# Patient Record
Sex: Female | Born: 1990 | Race: White | Hispanic: No | Marital: Married | Smoking: Never smoker
Health system: Southern US, Community
[De-identification: ages and names within clinical notes are randomized; demographics above are authoritative.]

## PROBLEM LIST (undated history)

## (undated) DIAGNOSIS — N6320 Unspecified lump in the left breast, unspecified quadrant: Secondary | ICD-10-CM

## (undated) DIAGNOSIS — R51 Headache: Secondary | ICD-10-CM

## (undated) DIAGNOSIS — R519 Headache, unspecified: Secondary | ICD-10-CM

## (undated) HISTORY — PX: WISDOM TOOTH EXTRACTION: SHX21

---

## 2011-01-31 ENCOUNTER — Ambulatory Visit: Payer: Self-pay | Admitting: Internal Medicine

## 2011-01-31 LAB — RAPID STREP-A WITH REFLX: Micro Text Report: NEGATIVE

## 2013-04-28 ENCOUNTER — Ambulatory Visit: Payer: Self-pay | Admitting: Emergency Medicine

## 2013-04-28 LAB — RAPID STREP-A WITH REFLX: MICRO TEXT REPORT: NEGATIVE

## 2013-05-01 LAB — BETA STREP CULTURE(ARMC)

## 2014-06-07 ENCOUNTER — Ambulatory Visit
Admission: EM | Admit: 2014-06-07 | Discharge: 2014-06-07 | Disposition: A | Discharge status: Home / Self Care | Payer: 59 | Attending: Family Medicine | Admitting: Family Medicine

## 2014-06-07 DIAGNOSIS — N390 Urinary tract infection, site not specified: Secondary | ICD-10-CM | POA: Diagnosis not present

## 2014-06-07 DIAGNOSIS — R300 Dysuria: Secondary | ICD-10-CM | POA: Diagnosis present

## 2014-06-07 DIAGNOSIS — R319 Hematuria, unspecified: Secondary | ICD-10-CM

## 2014-06-07 LAB — URINALYSIS COMPLETE WITH MICROSCOPIC (ARMC ONLY)
Bilirubin Urine: NEGATIVE
GLUCOSE, UA: NEGATIVE mg/dL
KETONES UR: NEGATIVE mg/dL
Nitrite: POSITIVE — AB
PH: 5 (ref 5.0–8.0)
Protein, ur: 30 mg/dL — AB
SPECIFIC GRAVITY, URINE: 1.03 (ref 1.005–1.030)
Squamous Epithelial / LPF: NONE SEEN — AB

## 2014-06-07 MED ORDER — PHENAZOPYRIDINE HCL 200 MG PO TABS: 200.0000 mg | ORAL_TABLET | Freq: Three times a day (TID) | ORAL | Status: DC | PRN

## 2014-06-07 MED ORDER — CIPROFLOXACIN HCL 500 MG PO TABS: 500.0000 mg | ORAL_TABLET | Freq: Two times a day (BID) | ORAL | Status: DC

## 2014-06-07 MED ORDER — FLUCONAZOLE 150 MG PO TABS: 150.0000 mg | ORAL_TABLET | Freq: Once | ORAL | Status: DC

## 2014-06-07 NOTE — ED Notes (Signed)
Pt states "I think I have a UTi" c/o increased urgency/frequency with painful urination for the past 3 days..states same sx before with UTI

## 2014-06-07 NOTE — ED Provider Notes (Signed)
CSN: [id number]     Arrival date & time 06/07/14  1159 History   First MD Initiated Contact with Patient 06/07/14 1307     Chief Complaint  Patient presents with  . Urinary Frequency   (Consider location/radiation/quality/duration/timing/severity/associated sxs/prior Treatment) Patient is a 24 y.o. female presenting with frequency and dysuria. The history is provided by the patient. No language interpreter was used.  Urinary Frequency This is a new (Only one urinary tract infection before) problem. The current episode started more than 2 days ago (Started about 3-4 days ago). The problem has been gradually worsening. Pertinent negatives include no chest pain, no abdominal pain and no headaches. Nothing aggravates the symptoms. Nothing relieves the symptoms. She has tried nothing for the symptoms.  Dysuria Pain quality:  Burning Pain severity:  Moderate Onset quality:  Sudden Timing:  Constant Progression:  Waxing and waning Chronicity:  New Recent urinary tract infections: no   Relieved by:  None tried Associated symptoms: no abdominal pain   Risk factors: no hx of pyelonephritis, no hx of urolithiasis, no kidney transplant, not pregnant, no recurrent urinary tract infections, no renal cysts, no renal disease, not sexually active, not single kidney, no sexually transmitted infections and no urinary catheter     History reviewed. No pertinent past medical history. Past Surgical History  Procedure Laterality Date  . Wisdom tooth extraction     History reviewed. No pertinent family history. History  Substance Use Topics  . Smoking status: Never Smoker   . Smokeless tobacco: Not on file  . Alcohol Use: No   OB History    Gravida Para Term Preterm AB TAB SAB Ectopic Multiple Living   0 0 0 0 0 0 0 0 0 0      Review of Systems  Constitutional: Negative for activity change and appetite change.  Cardiovascular: Negative for chest pain.  Gastrointestinal: Negative for abdominal  pain.  Genitourinary: Positive for dysuria and frequency.  Neurological: Negative for headaches.    Allergies  Review of patient's allergies indicates no known allergies.  Home Medications   Prior to Admission medications   Medication Sig Start Date End Date Taking? Authorizing Provider  Norgestimate-Ethinyl Estradiol Triphasic 0.18/0.215/0.25 MG-35 MCG tablet Take 1 tablet by mouth daily.   Yes Historical Provider, MD  ciprofloxacin (CIPRO) 500 MG tablet Take 1 tablet (500 mg total) by mouth 2 (two) times daily. 06/07/14   Frederich Cha, MD  fluconazole (DIFLUCAN) 150 MG tablet Take 1 tablet (150 mg total) by mouth once. If needed for yeast infection. 06/07/14   Frederich Cha, MD  phenazopyridine (PYRIDIUM) 200 MG tablet Take 1 tablet (200 mg total) by mouth 3 (three) times daily as needed for pain. 06/07/14   Frederich Cha, MD   BP 107/61 mmHg  Pulse 77  Temp(Src) 98.6 F (37 C) (Oral)  Resp 16  Ht 5\' 5"  (1.651 m)  Wt 118 lb (53.524 kg)  BMI 19.64 kg/m2  SpO2 100%  LMP 05/19/2014 Physical Exam  Constitutional: She is oriented to person, place, and time. She appears well-developed and well-nourished.  HENT:  Head: Normocephalic and atraumatic.  Eyes: Pupils are equal, round, and reactive to light.  Neck: Neck supple.  Abdominal: Soft.  Neurological: She is alert and oriented to person, place, and time.  Skin: Skin is warm.  Psychiatric: Her behavior is normal.    ED Course  Procedures (including critical care time) Labs Review Labs Reviewed  URINALYSIS COMPLETEWITH MICROSCOPIC [facility])  - Abnormal; Notable for the  following:    APPearance CLOUDY (*)    Hgb urine dipstick 3+ (*)    Protein, ur 30 (*)    Nitrite POSITIVE (*)    Leukocytes, UA 1+ (*)    Bacteria, UA MANY (*)    Squamous Epithelial / LPF NONE SEEN (*)    All other components within normal limits  URINE CULTURE   After discussion we'll place on Diflucan, Pyridium and Cipro for 5 days. Recommend follow due  to blood in the urine in 2 weeks to make sure blood has cleared. Imaging Review No results found.   MDM   1. Urinary tract infection, acute   2. Hematuria        Frederich Cha, MD 06/09/14 [phone]

## 2014-06-07 NOTE — Discharge Instructions (Signed)
Antibiotic Medication Antibiotic medicine helps fight germs. Germs cause infections. This type of medicine will not work for colds, flu, or other viral infections. Tell your doctor if you:  Are allergic to any medicines.  Are pregnant or are trying to get pregnant.  Are taking other medicines.  Have other medical problems. HOME CARE  Take your medicine with a glass of water or food as told by your doctor.  Take the medicine as told. Finish them even if you start to feel better.  Do not give your medicine to other people.  Do not use your medicine in the future for a different infection.  Ask your doctor about which side effects to watch for.  Try not to miss any doses. If you miss a dose, take it as soon as possible. If it is almost time for your next dose, and your dosing schedule is:  Two doses a day, take the missed dose and the next dose 5 to 6 hours later.  Three or more doses a day, take the missed dose and the next dose 2 to 4 hours later, or double your next dose.  Then go back to your normal schedule. GET HELP RIGHT AWAY IF:   You get worse or do not get better within a few days.  The medicine makes you sick.  You develop a rash or any other side effects.  You have questions or concerns. MAKE SURE YOU:  Understand these instructions.  Will watch your condition.  Will get help right away if you are not doing well or get worse. Document Released: 10/21/2007 Document Revised: 04/05/2011 Document Reviewed: 12/17/2008 [facility] Patient Information 2015 [place], [place]. This information is not intended to replace advice given to you by your health care provider. Make sure you discuss any questions you have with your health care provider. Urinary Tract Infection A urinary tract infection (UTI) can occur any place along the urinary tract. The tract includes the kidneys, ureters, bladder, and urethra. A type of germ called bacteria often causes a UTI. UTIs are often  helped with antibiotic medicine.  HOME CARE   If given, take antibiotics as told by your doctor. Finish them even if you start to feel better.  Drink enough fluids to keep your pee (urine) clear or pale yellow.  Avoid tea, drinks with caffeine, and bubbly (carbonated) drinks.  Pee often. Avoid holding your pee in for a long time.  Pee before and after having sex (intercourse).  Wipe from front to back after you poop (bowel movement) if you are a woman. Use each tissue only once. GET HELP RIGHT AWAY IF:   You have back pain.  You have lower belly (abdominal) pain.  You have chills.  You feel sick to your stomach (nauseous).  You throw up (vomit).  Your burning or discomfort with peeing does not go away.  You have a fever.  Your symptoms are not better in 3 days. MAKE SURE YOU:   Understand these instructions.  Will watch your condition.  Will get help right away if you are not doing well or get worse. Document Released: 06/30/2007 Document Revised: 10/06/2011 Document Reviewed: 08/12/2011 [facility] Patient Information 2015 [place], [place]. This information is not intended to replace advice given to you by your health care provider. Make sure you discuss any questions you have with your health care provider. Hematuria Hematuria is blood in your urine. It can be caused by a bladder infection, kidney infection, prostate infection, kidney stone, or cancer  of your urinary tract. Infections can usually be treated with medicine, and a kidney stone usually will pass through your urine. If neither of these is the cause of your hematuria, further workup to find out the reason may be needed. It is very important that you tell your health care provider about any blood you see in your urine, even if the blood stops without treatment or happens without causing pain. Blood in your urine that happens and then stops and then happens again can be a symptom of a very serious condition. Also,  pain is not a symptom in the initial stages of many urinary cancers. HOME CARE INSTRUCTIONS   Drink lots of fluid, 3-4 quarts a day. If you have been diagnosed with an infection, cranberry juice is especially recommended, in addition to large amounts of water.  Avoid caffeine, tea, and carbonated beverages because they tend to irritate the bladder.  Avoid alcohol because it may irritate the prostate.  Take all medicines as directed by your health care provider.  If you were prescribed an antibiotic medicine, finish it all even if you start to feel better.  If you have been diagnosed with a kidney stone, follow your health care provider's instructions regarding straining your urine to catch the stone.  Empty your bladder often. Avoid holding urine for long periods of time.  After a bowel movement, women should cleanse front to back. Use each tissue only once.  Empty your bladder before and after sexual intercourse if you are a female. SEEK MEDICAL CARE IF:  You develop back pain.  You have a fever.  You have a feeling of sickness in your stomach (nausea) or vomiting.  Your symptoms are not better in 3 days. Return sooner if you are getting worse. SEEK IMMEDIATE MEDICAL CARE IF:   You develop severe vomiting and are unable to keep the medicine down.  You develop severe back or abdominal pain despite taking your medicines.  You begin passing a large amount of blood or clots in your urine.  You feel extremely weak or faint, or you pass out. MAKE SURE YOU:   Understand these instructions.  Will watch your condition.  Will get help right away if you are not doing well or get worse. Document Released: 01/11/2005 Document Revised: 05/28/2013 Document Reviewed: 09/11/2012 [facility] Patient Information 2015 [place], [place]. This information is not intended to replace advice given to you by your health care provider. Make sure you discuss any questions you have with your health care  provider.

## 2014-06-09 LAB — URINE CULTURE: Culture: >=100000

## 2015-03-03 DIAGNOSIS — L700 Acne vulgaris: Secondary | ICD-10-CM | POA: Diagnosis not present

## 2015-03-06 DIAGNOSIS — L700 Acne vulgaris: Secondary | ICD-10-CM | POA: Diagnosis not present

## 2015-04-03 DIAGNOSIS — L700 Acne vulgaris: Secondary | ICD-10-CM | POA: Diagnosis not present

## 2015-05-01 DIAGNOSIS — L700 Acne vulgaris: Secondary | ICD-10-CM | POA: Diagnosis not present

## 2015-05-01 DIAGNOSIS — Z79899 Other long term (current) drug therapy: Secondary | ICD-10-CM | POA: Diagnosis not present

## 2015-06-03 DIAGNOSIS — L700 Acne vulgaris: Secondary | ICD-10-CM | POA: Diagnosis not present

## 2015-06-03 DIAGNOSIS — Z79899 Other long term (current) drug therapy: Secondary | ICD-10-CM | POA: Diagnosis not present

## 2015-07-03 DIAGNOSIS — L853 Xerosis cutis: Secondary | ICD-10-CM | POA: Diagnosis not present

## 2015-07-03 DIAGNOSIS — L700 Acne vulgaris: Secondary | ICD-10-CM | POA: Diagnosis not present

## 2015-07-03 DIAGNOSIS — Z79899 Other long term (current) drug therapy: Secondary | ICD-10-CM | POA: Diagnosis not present

## 2015-07-21 DIAGNOSIS — Z01419 Encounter for gynecological examination (general) (routine) without abnormal findings: Secondary | ICD-10-CM | POA: Diagnosis not present

## 2015-07-21 DIAGNOSIS — Z3041 Encounter for surveillance of contraceptive pills: Secondary | ICD-10-CM | POA: Diagnosis not present

## 2015-08-03 ENCOUNTER — Ambulatory Visit
Admission: EM | Admit: 2015-08-03 | Discharge: 2015-08-03 | Disposition: A | Discharge status: Home / Self Care | Payer: 59 | Attending: Family Medicine | Admitting: Family Medicine

## 2015-08-03 DIAGNOSIS — J069 Acute upper respiratory infection, unspecified: Secondary | ICD-10-CM | POA: Diagnosis not present

## 2015-08-03 MED ORDER — BENZONATATE 100 MG PO CAPS: 100.0000 mg | ORAL_CAPSULE | Freq: Three times a day (TID) | ORAL | Status: DC

## 2015-08-03 MED ORDER — AZITHROMYCIN 250 MG PO TABS: ORAL_TABLET | ORAL | Status: DC

## 2015-08-03 NOTE — ED Notes (Signed)
Patient complains of cough and congestion x 1 weeks. Patient denies fevers, headaches. Patient states that she tried cold and flu medicines without relief.

## 2015-08-03 NOTE — ED Provider Notes (Signed)
CSN: [id number]     Arrival date & time 08/03/15  1342 History   First MD Initiated Contact with Patient 08/03/15 1404     Chief Complaint  Patient presents with  . Cough   (Consider location/radiation/quality/duration/timing/severity/associated sxs/prior Treatment) HPI: Patient presents today with symptoms of cough and congestion for a week. She states that she has some postnasal drip. She has tried some over-the-counter medications without relief. She does work in the NICU. She does not have any history of asthma. She does not smoke. She denies any history of fevers. Has had a minimally productive cough recently. She denies any wheezing, chest pain, shortness of breath.  History reviewed. No pertinent past medical history. Past Surgical History  Procedure Laterality Date  . Wisdom tooth extraction     History reviewed. No pertinent family history. Social History  Substance Use Topics  . Smoking status: Never Smoker   . Smokeless tobacco: None  . Alcohol Use: No   OB History    Gravida Para Term Preterm AB TAB SAB Ectopic Multiple Living   0 0 0 0 0 0 0 0 0 0      Review of Systems: Negative except mentioned above.   Allergies  Review of patient's allergies indicates no known allergies.  Home Medications   Prior to Admission medications   Medication Sig Start Date End Date Taking? Authorizing Provider  ISOtretinoin (ACCUTANE) 40 MG capsule Take 40 mg by mouth 2 (two) times daily.   Yes Historical Provider, MD  Norgestimate-Ethinyl Estradiol Triphasic 0.18/0.215/0.25 MG-35 MCG tablet Take 1 tablet by mouth daily.   Yes Historical Provider, MD  ciprofloxacin (CIPRO) 500 MG tablet Take 1 tablet (500 mg total) by mouth 2 (two) times daily. 06/07/14   Frederich Cha, MD  fluconazole (DIFLUCAN) 150 MG tablet Take 1 tablet (150 mg total) by mouth once. If needed for yeast infection. 06/07/14   Frederich Cha, MD  phenazopyridine (PYRIDIUM) 200 MG tablet Take 1 tablet (200 mg total) by mouth 3  (three) times daily as needed for pain. 06/07/14   Frederich Cha, MD   Meds Ordered and Administered this Visit  Medications - No data to display  BP 107/70 mmHg  Pulse 76  Temp(Src) 97.2 F (36.2 C) (Tympanic)  Resp 17  Ht 5\' 5"  (1.651 m)  Wt 127 lb (57.607 kg)  BMI 21.13 kg/m2  SpO2 100%  LMP 07/13/2015 No data found.   Physical Exam:   GENERAL: NAD HEENT: mild pharyngeal erythema, no exudate, no erythema of TMs, no cervical LAD RESP: CTA B, no accessory muscle use, no wheezing or rhonchi appreciated CARD: RRR NEURO: CN II-XII grossly intact   ED Course  Procedures (including critical care time)  Labs Review Labs Reviewed - No data to display  Imaging Review No results found.     MDM  A/P: URI- patient given prescription for Tessalon Perles, Claritin when necessary, take Z-Pak if symptoms persistent after a few days, rest, hydration, seek medical attention if symptoms persist or worsen.    Paulina Fusi, MD 08/03/15 [phone]

## 2015-08-04 DIAGNOSIS — L700 Acne vulgaris: Secondary | ICD-10-CM | POA: Diagnosis not present

## 2015-08-04 DIAGNOSIS — Z79899 Other long term (current) drug therapy: Secondary | ICD-10-CM | POA: Diagnosis not present

## 2015-09-03 DIAGNOSIS — Z79899 Other long term (current) drug therapy: Secondary | ICD-10-CM | POA: Diagnosis not present

## 2015-09-03 DIAGNOSIS — L700 Acne vulgaris: Secondary | ICD-10-CM | POA: Diagnosis not present

## 2015-09-16 DIAGNOSIS — H52223 Regular astigmatism, bilateral: Secondary | ICD-10-CM | POA: Diagnosis not present

## 2015-10-03 DIAGNOSIS — L700 Acne vulgaris: Secondary | ICD-10-CM | POA: Diagnosis not present

## 2015-10-03 DIAGNOSIS — Z79899 Other long term (current) drug therapy: Secondary | ICD-10-CM | POA: Diagnosis not present

## 2015-10-30 DIAGNOSIS — L700 Acne vulgaris: Secondary | ICD-10-CM | POA: Diagnosis not present

## 2015-10-30 DIAGNOSIS — L308 Other specified dermatitis: Secondary | ICD-10-CM | POA: Diagnosis not present

## 2015-10-30 DIAGNOSIS — L309 Dermatitis, unspecified: Secondary | ICD-10-CM | POA: Diagnosis not present

## 2015-10-30 DIAGNOSIS — L0292 Furuncle, unspecified: Secondary | ICD-10-CM | POA: Diagnosis not present

## 2015-12-03 DIAGNOSIS — T887XXA Unspecified adverse effect of drug or medicament, initial encounter: Secondary | ICD-10-CM | POA: Diagnosis not present

## 2016-08-30 DIAGNOSIS — Z124 Encounter for screening for malignant neoplasm of cervix: Secondary | ICD-10-CM | POA: Diagnosis not present

## 2016-08-30 DIAGNOSIS — Z3049 Encounter for surveillance of other contraceptives: Secondary | ICD-10-CM | POA: Diagnosis not present

## 2016-08-30 DIAGNOSIS — Z01419 Encounter for gynecological examination (general) (routine) without abnormal findings: Secondary | ICD-10-CM | POA: Diagnosis not present

## 2016-09-21 DIAGNOSIS — R87615 Unsatisfactory cytologic smear of cervix: Secondary | ICD-10-CM | POA: Diagnosis not present

## 2017-02-03 DIAGNOSIS — L700 Acne vulgaris: Secondary | ICD-10-CM | POA: Diagnosis not present

## 2017-02-03 DIAGNOSIS — Z79899 Other long term (current) drug therapy: Secondary | ICD-10-CM | POA: Diagnosis not present

## 2017-03-07 DIAGNOSIS — L700 Acne vulgaris: Secondary | ICD-10-CM | POA: Diagnosis not present

## 2017-03-07 DIAGNOSIS — Z79899 Other long term (current) drug therapy: Secondary | ICD-10-CM | POA: Diagnosis not present

## 2017-04-05 DIAGNOSIS — L709 Acne, unspecified: Secondary | ICD-10-CM | POA: Diagnosis not present

## 2017-04-05 DIAGNOSIS — Z79899 Other long term (current) drug therapy: Secondary | ICD-10-CM | POA: Diagnosis not present

## 2017-04-05 DIAGNOSIS — L700 Acne vulgaris: Secondary | ICD-10-CM | POA: Diagnosis not present

## 2017-05-05 DIAGNOSIS — L700 Acne vulgaris: Secondary | ICD-10-CM | POA: Diagnosis not present

## 2017-05-05 DIAGNOSIS — Z79899 Other long term (current) drug therapy: Secondary | ICD-10-CM | POA: Diagnosis not present

## 2017-06-02 DIAGNOSIS — Z79899 Other long term (current) drug therapy: Secondary | ICD-10-CM | POA: Diagnosis not present

## 2017-06-02 DIAGNOSIS — L700 Acne vulgaris: Secondary | ICD-10-CM | POA: Diagnosis not present

## 2017-06-02 DIAGNOSIS — L709 Acne, unspecified: Secondary | ICD-10-CM | POA: Diagnosis not present

## 2017-06-30 DIAGNOSIS — Z79899 Other long term (current) drug therapy: Secondary | ICD-10-CM | POA: Diagnosis not present

## 2017-06-30 DIAGNOSIS — L700 Acne vulgaris: Secondary | ICD-10-CM | POA: Diagnosis not present

## 2017-06-30 DIAGNOSIS — L709 Acne, unspecified: Secondary | ICD-10-CM | POA: Diagnosis not present

## 2017-07-20 ENCOUNTER — Other Ambulatory Visit: Payer: Self-pay

## 2017-07-20 ENCOUNTER — Ambulatory Visit
Admission: EM | Admit: 2017-07-20 | Discharge: 2017-07-20 | Disposition: A | Discharge status: Home / Self Care | Payer: 59 | Attending: Emergency Medicine | Admitting: Emergency Medicine

## 2017-07-20 DIAGNOSIS — L03012 Cellulitis of left finger: Secondary | ICD-10-CM

## 2017-07-20 MED ORDER — FLUCONAZOLE 150 MG PO TABS: 150.0000 mg | ORAL_TABLET | Freq: Once | ORAL | 0 refills | Status: AC

## 2017-07-20 MED ORDER — SULFAMETHOXAZOLE-TRIMETHOPRIM 800-160 MG PO TABS: 1.0000 | ORAL_TABLET | Freq: Two times a day (BID) | ORAL | 0 refills | Status: AC

## 2017-07-20 NOTE — ED Provider Notes (Signed)
[facility]    CSN: [id number] Arrival date & time: 07/20/17  1154     History   Chief Complaint Chief Complaint  Patient presents with  . Hand Pain    left ring finger    HPI Kelsey Edwards is a 27 y.o. female.   27 year old female presents with left 4th/ring finger swelling, redness and irritation around the nail for over 2 weeks. Started with some irritation/possible hang-nail which has continued to get worse. Yellowish pus developed and she has "popped" the pus pocket but continues to be more swollen and red. She has tried soaking her finger, applying topical antibiotic ointment and even took a Zpak with no relief. She has had skin infections around the nail before but never this severe. She is concerned that this infection will not go away. No other chronic health issues. Currently on Accutane and Triphasic OCP's.    The history is provided by the patient.    History reviewed. No pertinent past medical history.  There are no active problems to display for this patient.   Past Surgical History:  Procedure Laterality Date  . WISDOM TOOTH EXTRACTION      OB History    Gravida  0   Para  0   Term  0   Preterm  0   AB  0   Living  0     SAB  0   TAB  0   Ectopic  0   Multiple  0   Live Births               Home Medications    Prior to Admission medications   Medication Sig Start Date End Date Taking? Authorizing Provider  ISOtretinoin (ACCUTANE) 40 MG capsule Take 40 mg by mouth 2 (two) times daily.   Yes [provider]  Norgestimate-Ethinyl Estradiol Triphasic 0.18/0.215/0.25 MG-35 MCG tablet Take 1 tablet by mouth daily.   Yes [provider]  fluconazole (DIFLUCAN) 150 MG tablet Take 1 tablet (150 mg total) by mouth once for 1 dose. 07/20/17 07/20/17  Katy Apo, NP  sulfamethoxazole-trimethoprim (BACTRIM DS,SEPTRA DS) 800-160 MG tablet Take 1 tablet by mouth 2 (two) times daily for 10 days. 07/20/17 07/30/17   Katy Apo, NP    Family History History reviewed. No pertinent family history.  Social History Social History   Tobacco Use  . Smoking status: Never Smoker  . Smokeless tobacco: Never Used  Substance Use Topics  . Alcohol use: No    Alcohol/week: 0.0 oz  . Drug use: No     Allergies   Patient has no known allergies.   Review of Systems Review of Systems  Constitutional: Negative for activity change, appetite change, chills, fatigue and fever.  Gastrointestinal: Negative for nausea and vomiting.  Musculoskeletal: Negative for arthralgias, joint swelling and myalgias.  Skin: Positive for color change and wound. Negative for rash.  Allergic/Immunologic: Negative for immunocompromised state.  Neurological: Negative for dizziness, seizures, syncope, weakness, light-headedness, numbness and headaches.  Hematological: Negative for adenopathy. Does not bruise/bleed easily.  Psychiatric/Behavioral: Negative.      Physical Exam Triage Vital Signs ED Triage Vitals  Enc Vitals Group     BP 07/20/17 1206 108/70     Pulse Rate 07/20/17 1206 64     Resp 07/20/17 1206 16     Temp 07/20/17 1206 98.4 F (36.9 C)     Temp Source 07/20/17 1206 Oral     SpO2  07/20/17 1206 100 %     Weight 07/20/17 1204 132 lb (59.9 kg)     Height 07/20/17 1204 5\' 5"  (1.651 m)     Head Circumference --      Peak Flow --      Pain Score 07/20/17 1204 7     Pain Loc --      Pain Edu? --      Excl. in [place]? --    No data found.  Updated Vital Signs BP 108/70 (BP Location: Right Arm)   Pulse 64   Temp 98.4 F (36.9 C) (Oral)   Resp 16   Ht 5\' 5"  (1.651 m)   Wt 132 lb (59.9 kg)   LMP 07/10/2017   SpO2 100%   BMI 21.97 kg/m   Visual Acuity Right Eye Distance:   Left Eye Distance:   Bilateral Distance:    Right Eye Near:   Left Eye Near:    Bilateral Near:     Physical Exam  Constitutional: She is oriented to person, place, and time. Vital signs are normal. She appears  well-developed and well-nourished. She is cooperative. No distress.  Patient sitting on exam table in no acute distress.   HENT:  Head: Normocephalic and atraumatic.  Eyes: Conjunctivae and EOM are normal.  Neck: Normal range of motion.  Cardiovascular: Normal rate.  Pulmonary/Chest: Effort normal.  Musculoskeletal: She exhibits tenderness.       Left hand: She exhibits tenderness and swelling. She exhibits normal range of motion, normal two-point discrimination, normal capillary refill, no deformity and no laceration. Normal sensation noted. Normal strength noted.       Hands: Full range of motion of left hand and fingers. Redness extending from base of nail to lateral aspect of nail and under distal aspect of nail. Very tender. Lateral skin is swollen with yellowish pus present. No discoloration of nail. Good pulses and capillary refill. No neuro deficits noted.   Neurological: She is alert and oriented to person, place, and time.  Skin: Skin is warm and dry. Capillary refill takes less than 2 seconds. There is erythema. No cyanosis. Nails show no clubbing.  Psychiatric: She has a normal mood and affect. Her behavior is normal. Judgment and thought content normal.     UC Treatments / Results  Labs (all labs ordered are listed, but only abnormal results are displayed) Labs Reviewed - No data to display  EKG None  Radiology No results found.  Procedures Procedures (including critical care time)  Medications Ordered in UC Medications - No data to display  Initial Impression / Assessment and Plan / UC Course  I have reviewed the triage vital signs and the nursing notes.  Pertinent labs & imaging results that were available during my care of the patient were reviewed by me and considered in my medical decision making (see chart for details).     Offered to try to I & D small abscess next to nail but patient declined. Would like to trial a different oral antibiotic. Recommend  start Bactrim twice a day as directed. May take Diflucan 150mg  one time if antibiotic-associated yeast vaginitis develops. Recommend continue to apply warm compresses/soaks to area. Take Ibuprofen 600mg  every 6 hours as needed for pain. Follow-up with her Dermatologist next week (5 days) if not improving.  Final Clinical Impressions(s) / UC Diagnoses   Final diagnoses:  Paronychia of left ring finger     Discharge Instructions     Recommend start Bactrim 1  tablet by mouth twice a day as directed. Continue to soak finger for comfort. May take Diflucan 150mg  one tablet if you develop antibiotic-induced yeast infection. May take Ibuprofen 600mg  every 6 hours as needed for pain and swelling. Follow-up with your Dermatologist next week if not improving.     ED Prescriptions    Medication Sig Dispense Auth. Provider   sulfamethoxazole-trimethoprim (BACTRIM DS,SEPTRA DS) 800-160 MG tablet Take 1 tablet by mouth 2 (two) times daily for 10 days. 20 tablet Katy Apo, NP   fluconazole (DIFLUCAN) 150 MG tablet Take 1 tablet (150 mg total) by mouth once for 1 dose. 1 tablet Katy Apo, NP     Controlled Substance Prescriptions [place] Controlled Substance Registry consulted? Not Applicable   Katy Apo, NP 07/20/17 2132

## 2017-07-20 NOTE — Discharge Instructions (Signed)
Recommend start Bactrim 1 tablet by mouth twice a day as directed. Continue to soak finger for comfort. May take Diflucan 150mg  one tablet if you develop antibiotic-induced yeast infection. May take Ibuprofen 600mg  every 6 hours as needed for pain and swelling. Follow-up with your Dermatologist next week if not improving.

## 2017-07-20 NOTE — ED Triage Notes (Signed)
Patient complains of a possible paronychia to left ring finger x 2 weeks. Patient states that she popped the area and has continued pain with swelling and redness.

## 2017-07-29 DIAGNOSIS — L709 Acne, unspecified: Secondary | ICD-10-CM | POA: Diagnosis not present

## 2017-07-29 DIAGNOSIS — Z79899 Other long term (current) drug therapy: Secondary | ICD-10-CM | POA: Diagnosis not present

## 2017-07-29 DIAGNOSIS — L0390 Cellulitis, unspecified: Secondary | ICD-10-CM | POA: Diagnosis not present

## 2017-08-26 DIAGNOSIS — L700 Acne vulgaris: Secondary | ICD-10-CM | POA: Diagnosis not present

## 2017-08-26 DIAGNOSIS — Z79899 Other long term (current) drug therapy: Secondary | ICD-10-CM | POA: Diagnosis not present

## 2017-09-14 ENCOUNTER — Other Ambulatory Visit: Payer: Self-pay | Admitting: Obstetrics and Gynecology

## 2017-09-14 DIAGNOSIS — Z3009 Encounter for other general counseling and advice on contraception: Secondary | ICD-10-CM | POA: Diagnosis not present

## 2017-09-14 DIAGNOSIS — N6320 Unspecified lump in the left breast, unspecified quadrant: Secondary | ICD-10-CM

## 2017-09-14 DIAGNOSIS — Z01419 Encounter for gynecological examination (general) (routine) without abnormal findings: Secondary | ICD-10-CM | POA: Diagnosis not present

## 2017-09-15 ENCOUNTER — Ambulatory Visit
Admission: RE | Admit: 2017-09-15 | Discharge: 2017-09-15 | Disposition: A | Discharge status: Home / Self Care | Payer: 59 | Source: Ambulatory Visit | Attending: Obstetrics and Gynecology | Admitting: Obstetrics and Gynecology

## 2017-09-15 DIAGNOSIS — N6320 Unspecified lump in the left breast, unspecified quadrant: Secondary | ICD-10-CM

## 2017-09-19 ENCOUNTER — Other Ambulatory Visit: Payer: Self-pay | Admitting: Obstetrics and Gynecology

## 2017-09-19 DIAGNOSIS — R928 Other abnormal and inconclusive findings on diagnostic imaging of breast: Secondary | ICD-10-CM

## 2017-09-19 DIAGNOSIS — N6320 Unspecified lump in the left breast, unspecified quadrant: Secondary | ICD-10-CM

## 2017-09-21 ENCOUNTER — Ambulatory Visit
Admission: RE | Admit: 2017-09-21 | Discharge: 2017-09-21 | Disposition: A | Discharge status: Home / Self Care | Payer: 59 | Source: Ambulatory Visit | Attending: Obstetrics and Gynecology | Admitting: Obstetrics and Gynecology

## 2017-09-21 DIAGNOSIS — N6320 Unspecified lump in the left breast, unspecified quadrant: Secondary | ICD-10-CM

## 2017-09-21 DIAGNOSIS — N6321 Unspecified lump in the left breast, upper outer quadrant: Secondary | ICD-10-CM | POA: Diagnosis not present

## 2017-09-21 DIAGNOSIS — R928 Other abnormal and inconclusive findings on diagnostic imaging of breast: Secondary | ICD-10-CM

## 2017-09-22 LAB — SURGICAL PATHOLOGY

## 2017-10-19 ENCOUNTER — Other Ambulatory Visit: Payer: Self-pay | Admitting: General Surgery

## 2017-10-19 DIAGNOSIS — N6320 Unspecified lump in the left breast, unspecified quadrant: Secondary | ICD-10-CM | POA: Diagnosis not present

## 2017-10-25 DIAGNOSIS — N6320 Unspecified lump in the left breast, unspecified quadrant: Secondary | ICD-10-CM

## 2017-10-25 HISTORY — DX: Unspecified lump in the left breast, unspecified quadrant: N63.20

## 2017-11-01 ENCOUNTER — Encounter (HOSPITAL_BASED_OUTPATIENT_CLINIC_OR_DEPARTMENT_OTHER): Payer: Self-pay | Admitting: *Deleted

## 2017-11-01 ENCOUNTER — Other Ambulatory Visit: Payer: Self-pay

## 2017-11-01 NOTE — Pre-Procedure Instructions (Signed)
To come pick up Ensure pre-surgery drink 10 oz. - to drink by 0915 DOS.

## 2017-11-03 MED ORDER — ENSURE PRE-SURGERY PO LIQD: 296.0000 mL | Freq: Once | ORAL | Status: DC

## 2017-11-03 NOTE — Progress Notes (Signed)
Patient picked up pre-surgery drink (Ensure) and was advised to drink at 0915am DOS.  Patient verbalized understanding.

## 2017-11-09 ENCOUNTER — Encounter (HOSPITAL_BASED_OUTPATIENT_CLINIC_OR_DEPARTMENT_OTHER): Payer: Self-pay | Admitting: *Deleted

## 2017-11-09 ENCOUNTER — Ambulatory Visit (HOSPITAL_BASED_OUTPATIENT_CLINIC_OR_DEPARTMENT_OTHER): Payer: 59 | Admitting: Certified Registered"

## 2017-11-09 ENCOUNTER — Ambulatory Visit (HOSPITAL_BASED_OUTPATIENT_CLINIC_OR_DEPARTMENT_OTHER)
Admission: RE | Admit: 2017-11-09 | Discharge: 2017-11-09 | Disposition: A | Discharge status: Home / Self Care | Payer: 59 | Source: Ambulatory Visit | Attending: General Surgery | Admitting: General Surgery

## 2017-11-09 ENCOUNTER — Other Ambulatory Visit: Payer: Self-pay

## 2017-11-09 ENCOUNTER — Encounter (HOSPITAL_BASED_OUTPATIENT_CLINIC_OR_DEPARTMENT_OTHER)
Admission: RE | Disposition: A | Discharge status: Home / Self Care | Payer: Self-pay | Source: Ambulatory Visit | Attending: General Surgery

## 2017-11-09 DIAGNOSIS — Z793 Long term (current) use of hormonal contraceptives: Secondary | ICD-10-CM | POA: Insufficient documentation

## 2017-11-09 DIAGNOSIS — N6320 Unspecified lump in the left breast, unspecified quadrant: Secondary | ICD-10-CM | POA: Diagnosis not present

## 2017-11-09 DIAGNOSIS — D242 Benign neoplasm of left breast: Secondary | ICD-10-CM | POA: Diagnosis not present

## 2017-11-09 HISTORY — DX: Unspecified lump in the left breast, unspecified quadrant: N63.20

## 2017-11-09 HISTORY — DX: Headache: R51

## 2017-11-09 HISTORY — DX: Headache, unspecified: R51.9

## 2017-11-09 HISTORY — PX: BREAST BIOPSY: SHX20

## 2017-11-09 LAB — POCT PREGNANCY, URINE: Preg Test, Ur: NEGATIVE

## 2017-11-09 SURGERY — BREAST BIOPSY
Anesthesia: General | Laterality: Left

## 2017-11-09 SURGERY — Surgical Case
Anesthesia: *Unknown

## 2017-11-09 MED ORDER — CHLORHEXIDINE GLUCONATE CLOTH 2 % EX PADS: 6.0000 | MEDICATED_PAD | Freq: Once | CUTANEOUS | Status: DC

## 2017-11-09 MED ORDER — SCOPOLAMINE 1 MG/3DAYS TD PT72: 1.0000 | MEDICATED_PATCH | Freq: Once | TRANSDERMAL | Status: DC | PRN

## 2017-11-09 MED ORDER — LACTATED RINGERS IV SOLN
INTRAVENOUS | Status: DC
  Administered 2017-11-09 (×2): via INTRAVENOUS

## 2017-11-09 MED ORDER — DEXAMETHASONE SODIUM PHOSPHATE 10 MG/ML IJ SOLN
INTRAMUSCULAR | Status: AC
  Filled 2017-11-09: qty 3

## 2017-11-09 MED ORDER — FENTANYL CITRATE (PF) 100 MCG/2ML IJ SOLN
50.0000 ug | INTRAMUSCULAR | Status: DC | PRN
  Administered 2017-11-09: 100 ug via INTRAVENOUS

## 2017-11-09 MED ORDER — LIDOCAINE HCL (CARDIAC) PF 100 MG/5ML IV SOSY
PREFILLED_SYRINGE | INTRAVENOUS | Status: DC | PRN
  Administered 2017-11-09: 60 mg via INTRAVENOUS

## 2017-11-09 MED ORDER — GABAPENTIN 100 MG PO CAPS
100.0000 mg | ORAL_CAPSULE | ORAL | Status: AC
  Administered 2017-11-09: 100 mg via ORAL

## 2017-11-09 MED ORDER — PROMETHAZINE HCL 25 MG/ML IJ SOLN: 6.2500 mg | INTRAMUSCULAR | Status: DC | PRN

## 2017-11-09 MED ORDER — MIDAZOLAM HCL 2 MG/2ML IJ SOLN
INTRAMUSCULAR | Status: AC
  Filled 2017-11-09: qty 2

## 2017-11-09 MED ORDER — ONDANSETRON HCL 4 MG/2ML IJ SOLN
INTRAMUSCULAR | Status: DC | PRN
  Administered 2017-11-09: 4 mg via INTRAVENOUS

## 2017-11-09 MED ORDER — ONDANSETRON HCL 4 MG/2ML IJ SOLN
INTRAMUSCULAR | Status: AC
  Filled 2017-11-09: qty 16

## 2017-11-09 MED ORDER — ACETAMINOPHEN 500 MG PO TABS
ORAL_TABLET | ORAL | Status: AC
  Filled 2017-11-09: qty 2

## 2017-11-09 MED ORDER — GABAPENTIN 100 MG PO CAPS
ORAL_CAPSULE | ORAL | Status: AC
  Filled 2017-11-09: qty 1

## 2017-11-09 MED ORDER — PROPOFOL 500 MG/50ML IV EMUL
INTRAVENOUS | Status: AC
  Filled 2017-11-09: qty 50

## 2017-11-09 MED ORDER — OXYCODONE HCL 5 MG/5ML PO SOLN: 5.0000 mg | Freq: Once | ORAL | Status: DC | PRN

## 2017-11-09 MED ORDER — CEFAZOLIN SODIUM-DEXTROSE 2-4 GM/100ML-% IV SOLN
2.0000 g | INTRAVENOUS | Status: AC
  Administered 2017-11-09: 2 g via INTRAVENOUS

## 2017-11-09 MED ORDER — BUPIVACAINE HCL (PF) 0.25 % IJ SOLN
INTRAMUSCULAR | Status: DC | PRN
  Administered 2017-11-09: 13 mL

## 2017-11-09 MED ORDER — HYDROMORPHONE HCL 1 MG/ML IJ SOLN: 0.2500 mg | INTRAMUSCULAR | Status: DC | PRN

## 2017-11-09 MED ORDER — CEFAZOLIN SODIUM-DEXTROSE 2-4 GM/100ML-% IV SOLN
INTRAVENOUS | Status: AC
  Filled 2017-11-09: qty 100

## 2017-11-09 MED ORDER — FENTANYL CITRATE (PF) 100 MCG/2ML IJ SOLN
INTRAMUSCULAR | Status: AC
  Filled 2017-11-09: qty 2

## 2017-11-09 MED ORDER — MIDAZOLAM HCL 2 MG/2ML IJ SOLN
1.0000 mg | INTRAMUSCULAR | Status: DC | PRN
  Administered 2017-11-09: 2 mg via INTRAVENOUS

## 2017-11-09 MED ORDER — LIDOCAINE 2% (20 MG/ML) 5 ML SYRINGE
INTRAMUSCULAR | Status: AC
  Filled 2017-11-09: qty 25

## 2017-11-09 MED ORDER — DEXAMETHASONE SODIUM PHOSPHATE 4 MG/ML IJ SOLN
INTRAMUSCULAR | Status: DC | PRN
  Administered 2017-11-09: 10 mg via INTRAVENOUS

## 2017-11-09 MED ORDER — PROPOFOL 10 MG/ML IV BOLUS
INTRAVENOUS | Status: DC | PRN
  Administered 2017-11-09: 200 mg via INTRAVENOUS

## 2017-11-09 MED ORDER — OXYCODONE HCL 5 MG PO TABS: 5.0000 mg | ORAL_TABLET | Freq: Once | ORAL | Status: DC | PRN

## 2017-11-09 MED ORDER — ACETAMINOPHEN 500 MG PO TABS
1000.0000 mg | ORAL_TABLET | ORAL | Status: AC
  Administered 2017-11-09: 1000 mg via ORAL

## 2017-11-09 SURGICAL SUPPLY — 55 items
APPLIER CLIP 9.375 MED OPEN (MISCELLANEOUS)
BINDER BREAST LRG (GAUZE/BANDAGES/DRESSINGS) IMPLANT
BINDER BREAST MEDIUM (GAUZE/BANDAGES/DRESSINGS) ×2 IMPLANT
BINDER BREAST XLRG (GAUZE/BANDAGES/DRESSINGS) IMPLANT
BINDER BREAST XXLRG (GAUZE/BANDAGES/DRESSINGS) IMPLANT
BLADE SURG 15 STRL LF DISP TIS (BLADE) ×1 IMPLANT
BLADE SURG 15 STRL SS (BLADE) ×1
CANISTER SUCT 1200ML W/VALVE (MISCELLANEOUS) IMPLANT
CHLORAPREP W/TINT 26ML (MISCELLANEOUS) ×2 IMPLANT
CLIP APPLIE 9.375 MED OPEN (MISCELLANEOUS) IMPLANT
CLIP VESOCCLUDE SM WIDE 6/CT (CLIP) IMPLANT
COVER BACK TABLE 60X90IN (DRAPES) ×2 IMPLANT
COVER MAYO STAND STRL (DRAPES) ×2 IMPLANT
COVER PROBE 5X48 (MISCELLANEOUS) ×1
COVER WAND RF STERILE (DRAPES) IMPLANT
DECANTER SPIKE VIAL GLASS SM (MISCELLANEOUS) IMPLANT
DERMABOND ADVANCED (GAUZE/BANDAGES/DRESSINGS) ×1
DERMABOND ADVANCED .7 DNX12 (GAUZE/BANDAGES/DRESSINGS) ×1 IMPLANT
DEVICE DUBIN W/COMP PLATE 8390 (MISCELLANEOUS) IMPLANT
DRAPE LAPAROSCOPIC ABDOMINAL (DRAPES) ×2 IMPLANT
DRAPE UTILITY XL STRL (DRAPES) ×2 IMPLANT
DRSG TEGADERM 4X4.75 (GAUZE/BANDAGES/DRESSINGS) IMPLANT
ELECT COATED BLADE 2.86 ST (ELECTRODE) ×2 IMPLANT
ELECT REM PT RETURN 9FT ADLT (ELECTROSURGICAL) ×2
ELECTRODE REM PT RTRN 9FT ADLT (ELECTROSURGICAL) ×1 IMPLANT
GAUZE SPONGE 4X4 12PLY STRL LF (GAUZE/BANDAGES/DRESSINGS) ×2 IMPLANT
GLOVE BIO SURGEON STRL SZ7 (GLOVE) ×2 IMPLANT
GLOVE BIOGEL PI IND STRL 7.5 (GLOVE) ×1 IMPLANT
GLOVE BIOGEL PI INDICATOR 7.5 (GLOVE) ×1
GOWN STRL REUS W/ TWL LRG LVL3 (GOWN DISPOSABLE) ×3 IMPLANT
GOWN STRL REUS W/TWL LRG LVL3 (GOWN DISPOSABLE) ×3
HEMOSTAT ARISTA ABSORB 3G PWDR (MISCELLANEOUS) IMPLANT
ILLUMINATOR WAVEGUIDE N/F (MISCELLANEOUS) ×2 IMPLANT
KIT CVR 48X5XPRB PLUP LF (MISCELLANEOUS) ×1 IMPLANT
KIT MARKER MARGIN INK (KITS) ×2 IMPLANT
LIGHT WAVEGUIDE WIDE FLAT (MISCELLANEOUS) IMPLANT
NEEDLE HYPO 25X1 1.5 SAFETY (NEEDLE) ×2 IMPLANT
NS IRRIG 1000ML POUR BTL (IV SOLUTION) ×2 IMPLANT
PACK BASIN DAY SURGERY FS (CUSTOM PROCEDURE TRAY) ×2 IMPLANT
PENCIL BUTTON HOLSTER BLD 10FT (ELECTRODE) ×2 IMPLANT
SLEEVE SCD COMPRESS KNEE MED (MISCELLANEOUS) ×2 IMPLANT
SPONGE LAP 4X18 RFD (DISPOSABLE) ×2 IMPLANT
STRIP CLOSURE SKIN 1/2X4 (GAUZE/BANDAGES/DRESSINGS) IMPLANT
SUT MNCRL AB 4-0 PS2 18 (SUTURE) IMPLANT
SUT MON AB 5-0 PS2 18 (SUTURE) ×2 IMPLANT
SUT SILK 2 0 SH (SUTURE) IMPLANT
SUT VIC AB 2-0 SH 27 (SUTURE) ×1
SUT VIC AB 2-0 SH 27XBRD (SUTURE) ×1 IMPLANT
SUT VIC AB 3-0 SH 27 (SUTURE) ×1
SUT VIC AB 3-0 SH 27X BRD (SUTURE) ×1 IMPLANT
SYR CONTROL 10ML LL (SYRINGE) ×2 IMPLANT
TOWEL GREEN STERILE FF (TOWEL DISPOSABLE) ×2 IMPLANT
TOWEL OR NON WOVEN STRL DISP B (DISPOSABLE) IMPLANT
TUBE CONNECTING 20X1/4 (TUBING) IMPLANT
YANKAUER SUCT BULB TIP NO VENT (SUCTIONS) IMPLANT

## 2017-11-09 NOTE — Anesthesia Preprocedure Evaluation (Addendum)
Anesthesia Evaluation  Patient identified by MRN, date of birth, ID band Patient awake    Reviewed: Allergy & Precautions, NPO status , Patient's Chart, lab work & pertinent test results  Airway Mallampati: I  TM Distance: >3 FB Neck ROM: Full    Dental no notable dental hx.    Pulmonary neg pulmonary ROS,    Pulmonary exam normal breath sounds clear to auscultation       Cardiovascular negative cardio ROS Normal cardiovascular exam Rhythm:Regular Rate:Normal     Neuro/Psych  Headaches, negative psych ROS   GI/Hepatic negative GI ROS, Neg liver ROS,   Endo/Other  negative endocrine ROS  Renal/GU negative Renal ROS     Musculoskeletal negative musculoskeletal ROS (+)   Abdominal   Peds  Hematology negative hematology ROS (+)   Anesthesia Other Findings LEFT BREAST MASS  Reproductive/Obstetrics                            Anesthesia Physical Anesthesia Plan  ASA: I  Anesthesia Plan: General   Post-op Pain Management:    Induction: Intravenous  PONV Risk Score and Plan: 3 and Dexamethasone, Ondansetron, Midazolam and Treatment may vary due to age or medical condition  Airway Management Planned: LMA  Additional Equipment:   Intra-op Plan:   Post-operative Plan: Extubation in OR  Informed Consent: I have reviewed the patients History and Physical, chart, labs and discussed the procedure including the risks, benefits and alternatives for the proposed anesthesia with the patient or authorized representative who has indicated his/her understanding and acceptance.   Dental advisory given  Plan Discussed with: CRNA  Anesthesia Plan Comments:         Anesthesia Quick Evaluation

## 2017-11-09 NOTE — Transfer of Care (Signed)
Immediate Anesthesia Transfer of Care Note  Patient: Kelsey Edwards  Procedure(s) Performed: LEFT BREAST MASS EXCISIONAL BIOPSY (Left )  Patient Location: PACU  Anesthesia Type:General  Level of Consciousness: drowsy and patient cooperative  Airway & Oxygen Therapy: Patient Spontanous Breathing and Patient connected to face mask oxygen  Post-op Assessment: Report given to RN and Post -op Vital signs reviewed and stable  Post vital signs: Reviewed and stable  Last Vitals:  Vitals Value Taken Time  BP    Temp    Pulse 60 11/09/2017  2:03 PM  Resp 10 11/09/2017  2:03 PM  SpO2 100 % 11/09/2017  2:03 PM  Vitals shown include unvalidated device data.  Last Pain:  Vitals:   11/09/17 1116  TempSrc: Oral  PainSc: 0-No pain      Patients Stated Pain Goal: 6 (01/10/23 4695)  Complications: No apparent anesthesia complications

## 2017-11-09 NOTE — Interval H&P Note (Signed)
History and Physical Interval Note:  11/09/2017 12:55 PM  Kelsey Edwards  has presented today for surgery, with the diagnosis of LEFT BREAST MASS  The various methods of treatment have been discussed with the patient and family. After consideration of risks, benefits and other options for treatment, the patient has consented to  Procedure(s): LEFT BREAST MASS EXCISIONAL BIOPSY (Left) as a surgical intervention .  The patient's history has been reviewed, patient examined, no change in status, stable for surgery.  I have reviewed the patient's chart and labs.  Questions were answered to the patient's satisfaction.     Rolm Bookbinder

## 2017-11-09 NOTE — Anesthesia Procedure Notes (Addendum)
Procedure Name: LMA Insertion Date/Time: 11/09/2017 1:16 PM Performed by: Signe Colt, CRNA Pre-anesthesia Checklist: Patient identified, Emergency Drugs available, Suction available and Patient being monitored Patient Re-evaluated:Patient Re-evaluated prior to induction Oxygen Delivery Method: Circle system utilized Preoxygenation: Pre-oxygenation with 100% oxygen Induction Type: IV induction Ventilation: Mask ventilation without difficulty LMA: LMA inserted LMA Size: 4.0 Number of attempts: 1 Airway Equipment and Method: Bite block Placement Confirmation: positive ETCO2 Tube secured with: Tape Dental Injury: Teeth and Oropharynx as per pre-operative assessment

## 2017-11-09 NOTE — Op Note (Signed)
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 We discussed options and have elected for excision.  Procedure: After informed consent obtained patient taken to OR.  Ancef was given. SCDs were in place.  She was placed under general anesthesia. She was prepped and draped in standard sterile surgical fashion.  A timeout was performed.  I make a low axillary incision after infiltrating local incision.  I identified the lesion with ultrasound.  I then tunneled to the lesion using the lighted retractor.  I then removed the mass and marked it with paint.  I passed this off the table.  I obtained hemostasis.  The breast tissue was closed with 2-0 vicryl. The skin was closed with 3-0 vicryl and 5-0 monocryl. Glue was placed. She tolerated well, was extubated and transferred to pacu stable.

## 2017-11-09 NOTE — H&P (Signed)
80 yof referred by Glenis Smoker for left breast mass. she works in Engineer, agricultural. in march noted a left breast mass that has increased in size to her. no nipple dc. she has no family history of ovarian and breast cancer. she had [place] and there is a 1.9x1.2x2.3 cm with no axillary lad. biopsy is done and is fibroepithelial lesion cannot rule out phyllodes tumor  Past Surgical History Sabino Gasser; 10/19/2017 3:04 PM) Breast Biopsy  Left. Gastric Bypass  Oral Surgery   Diagnostic Studies History Sabino Gasser; 10/19/2017 3:04 PM) Colonoscopy  never Mammogram  never Pap Smear  1-5 years ago  Allergies Sabino Gasser; 10/19/2017 3:07 PM) No Known Drug Allergies [10/19/2017]: Allergies Reconciled   Medication History Sabino Gasser; 10/19/2017 3:08 PM) Tri-Sprintec (0.18/0.215/0.25MG -35 MCG Tablet, Oral) Active. Medications Reconciled  Social History Sabino Gasser; 10/19/2017 3:04 PM) Alcohol use  Occasional alcohol use. Caffeine use  Carbonated beverages, Coffee, Tea. No drug use  Tobacco use  Never smoker.  Family History Sabino Gasser; 10/19/2017 3:04 PM) Hypertension  Brother. Melanoma  Father. Thyroid problems  Mother.  Pregnancy / Birth History Sabino Gasser; 10/19/2017 3:04 PM) Age at menarche  58 years. Contraceptive History  Oral contraceptives. Gravida  0 Para  0 Regular periods   Other Problems Sabino Gasser; 10/19/2017 3:04 PM) Back Pain  Lump In Breast     Review of Systems Sabino Gasser; 10/19/2017 3:04 PM) General Not Present- Appetite Loss, Chills, Fatigue, Fever, Night Sweats, Weight Gain and Weight Loss. Skin Not Present- Change in Wart/Mole, Dryness, Hives, Jaundice, New Lesions, Non-Healing Wounds, Rash and Ulcer. HEENT Not Present- Earache, Hearing Loss, Hoarseness, Nose Bleed, Oral Ulcers, Ringing in the Ears, Seasonal Allergies, Sinus Pain, Sore Throat, Visual Disturbances, Wears glasses/contact lenses and Yellow  Eyes. Respiratory Not Present- Bloody sputum, Chronic Cough, Difficulty Breathing, Snoring and Wheezing. Breast Present- Breast Mass. Not Present- Breast Pain, Nipple Discharge and Skin Changes. Cardiovascular Not Present- Chest Pain, Difficulty Breathing Lying Down, Leg Cramps, Palpitations, Rapid Heart Rate, Shortness of Breath and Swelling of Extremities. Gastrointestinal Present- Bloating and Excessive gas. Not Present- Abdominal Pain, Bloody Stool, Change in Bowel Habits, Chronic diarrhea, Constipation, Difficulty Swallowing, Gets full quickly at meals, Hemorrhoids, Indigestion, Nausea, Rectal Pain and Vomiting. Female Genitourinary Not Present- Frequency, Nocturia, Painful Urination, Pelvic Pain and Urgency. Musculoskeletal Not Present- Back Pain, Joint Pain, Joint Stiffness, Muscle Pain, Muscle Weakness and Swelling of Extremities. Neurological Present- Headaches. Not Present- Decreased Memory, Fainting, Numbness, Seizures, Tingling, Tremor, Trouble walking and Weakness. Psychiatric Not Present- Anxiety, Bipolar, Change in Sleep Pattern, Depression, Fearful and Frequent crying. Endocrine Not Present- Cold Intolerance, Excessive Hunger, Hair Changes, Heat Intolerance, Hot flashes and New Diabetes. Hematology Not Present- Blood Thinners, Easy Bruising, Excessive bleeding, Gland problems, HIV and Persistent Infections.  Vitals Sabino Gasser; 10/19/2017 3:08 PM) 10/19/2017 3:07 PM Weight: 136.13 lb Height: 65in Body Surface Area: 1.68 m Body Mass Index: 22.65 kg/m  Temp.: 98.50F(Oral)  Pulse: 81 (Regular)  BP: 122/80 (Sitting, Left Arm, Standard)       Physical Exam Rolm Bookbinder MD; 10/19/2017 3:31 PM) General Mental Status-Alert. Orientation-Oriented X3.  Breast Note: no nipple dc left breast very upper outer quadrant mass measuring 2 cm in size, mobile nontender   Lymphatic General Lymphatics -Note: no [place] adenopathy.  Head & Neck  General Head &  Neck Lymphatics: Bilateral - Description - Normal. Axillary  General Axillary Region: Bilateral - Description - Normal.    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 we discussed options including follow up US. after conversation she has elected to have excised. we will do through axillary incision. risks and recovery discussed

## 2017-11-09 NOTE — Discharge Instructions (Signed)
[facility] °Office Phone Number [phone] ° °POST OP INSTRUCTIONS °Take 400 mg of ibuprofen every 8 hours or 650 mg tylenol every 6 hours for next 72 hours then as needed. Use ice several times daily also. °Always review your discharge instruction sheet given to you by the facility where your surgery was performed. ° °IF YOU HAVE DISABILITY OR FAMILY LEAVE FORMS, YOU MUST BRING THEM TO THE OFFICE FOR PROCESSING.  DO NOT GIVE THEM TO YOUR DOCTOR. ° °1. A prescription for pain medication may be given to you upon discharge.  Take your pain medication as prescribed, if needed.  If narcotic pain medicine is not needed, then you may take acetaminophen (Tylenol), naprosyn (Alleve) or ibuprofen (Advil) as needed. °2. Take your usually prescribed medications unless otherwise directed °3. If you need a refill on your pain medication, please contact your pharmacy.  They will contact our office to request authorization.  Prescriptions will not be filled after 5pm or on week-ends. °4. You should eat very light the first 24 hours after surgery, such as soup, crackers, pudding, etc.  Resume your normal diet the day after surgery. °5. Most patients will experience some swelling and bruising in the breast.  Ice packs and a good support bra will help.  Wear the breast binder provided or a sports bra for 72 hours day and night.  After that wear a sports bra during the day until you return to the office. Swelling and bruising can take several days to resolve.  °6. It is common to experience some constipation if taking pain medication after surgery.  Increasing fluid intake and taking a stool softener will usually help or prevent this problem from occurring.  A mild laxative (Milk of Magnesia or Miralax) should be taken according to package directions if there are no bowel movements after 48 hours. °7. Unless discharge instructions indicate otherwise, you may remove your bandages 48 hours after surgery and you may  shower at that time.  You may have steri-strips (small skin tapes) in place directly over the incision.  These strips should be left on the skin for 7-10 days and will come off on their own.  If your surgeon used skin glue on the incision, you may shower in 24 hours.  The glue will flake off over the next 2-3 weeks.  Any sutures or staples will be removed at the office during your follow-up visit. °8. ACTIVITIES:  You may resume regular daily activities (gradually increasing) beginning the next day.  Wearing a good support bra or sports bra minimizes pain and swelling.  You may have sexual intercourse when it is comfortable. °a. You may drive when you no longer are taking prescription pain medication, you can comfortably wear a seatbelt, and you can safely maneuver your car and apply brakes. °b. RETURN TO WORK:  ______________________________________________________________________________________ °9. You should see your doctor in the office for a follow-up appointment approximately two weeks after your surgery.  Your doctor’s nurse will typically make your follow-up appointment when she calls you with your pathology report.  Expect your pathology report 3-4 business days after your surgery.  You may call to check if you do not hear from us after three days. °10. OTHER INSTRUCTIONS: _______________________________________________________________________________________________ _____________________________________________________________________________________________________________________________________ °_____________________________________________________________________________________________________________________________________ °_____________________________________________________________________________________________________________________________________ ° °WHEN TO CALL DR WAKEFIELD: °1. Fever over 101.0 °2. Nausea and/or vomiting. °3. Extreme swelling or bruising. °4. Continued bleeding from  incision. °5. Increased pain, redness, or drainage from the incision. ° °The clinic staff is available to   answer your questions during regular business hours.  Please don’t hesitate to call and ask to speak to one of the nurses for clinical concerns.  If you have a medical emergency, go to the nearest emergency room or call 911.  A surgeon from [facility] is always on call at the hospital. ° °For further questions, please visit [email or web address] ° ° ° ° °Post Anesthesia Home Care Instructions ° °Activity: °Get plenty of rest for the remainder of the day. A responsible individual must stay with you for 24 hours following the procedure.  °For the next 24 hours, DO NOT: °-Drive a car °-Operate machinery °-Drink alcoholic beverages °-Take any medication unless instructed by your physician °-Make any legal decisions or sign important papers. ° °Meals: °Start with liquid foods such as gelatin or soup. Progress to regular foods as tolerated. Avoid greasy, spicy, heavy foods. If nausea and/or vomiting occur, drink only clear liquids until the nausea and/or vomiting subsides. Call your physician if vomiting continues. ° °Special Instructions/Symptoms: °Your throat may feel dry or sore from the anesthesia or the breathing tube placed in your throat during surgery. If this causes discomfort, gargle with warm salt water. The discomfort should disappear within 24 hours. ° °If you had a scopolamine patch placed behind your ear for the management of post- operative nausea and/or vomiting: ° °1. The medication in the patch is effective for 72 hours, after which it should be removed.  Wrap patch in a tissue and discard in the trash. Wash hands thoroughly with soap and water. °2. You may remove the patch earlier than 72 hours if you experience unpleasant side effects which may include dry mouth, dizziness or visual disturbances. °3. Avoid touching the patch. Wash your hands with soap and water after contact  with the patch. °   ° °

## 2017-11-10 ENCOUNTER — Encounter (HOSPITAL_BASED_OUTPATIENT_CLINIC_OR_DEPARTMENT_OTHER): Payer: Self-pay | Admitting: General Surgery

## 2017-11-10 NOTE — Anesthesia Postprocedure Evaluation (Signed)
Anesthesia Post Note  Patient: Kelsey Edwards  Procedure(s) Performed: LEFT BREAST MASS EXCISIONAL BIOPSY (Left )     Patient location during evaluation: PACU Anesthesia Type: General Level of consciousness: awake and alert Pain management: pain level controlled Vital Signs Assessment: post-procedure vital signs reviewed and stable Respiratory status: spontaneous breathing, nonlabored ventilation, respiratory function stable and patient connected to nasal cannula oxygen Cardiovascular status: blood pressure returned to baseline and stable Postop Assessment: no apparent nausea or vomiting Anesthetic complications: no    Last Vitals:  Vitals:   11/09/17 1445 11/09/17 1510  BP: 111/75 108/76  Pulse:  62  Resp:  18  Temp:  36.6 C  SpO2:  100%    Last Pain:  Vitals:   11/09/17 1510  TempSrc:   PainSc: 2                  [name] P Kadee Philyaw

## 2018-09-23 IMAGING — MG US BREAST BX W LOC DEV 1ST LESION IMG BX SPEC US GUIDE*L*
1 series · 8 of 8 positions shown · non-contrast
Comparison: Previous exam(s).

ADDENDUM:
Pathology of the left breast biopsy revealed A. BREAST, LEFT [DATE] 6
CM FN; ULTRASOUND-GUIDED BIOPSY: FIBROEPITHELIAL LESION, SEE
COMMENT. Comment: Sections demonstrate cores of a fibroepithelial
lesion with intracanalicular architecture and mildly cellular myxoid
stroma. Focal and mild stromal cytologic atypia is noted. Mitotic
figures are not increased. The differential diagnosis includes a
fibroadenoma with intracanalicular growth pattern and phyllodes
tumor. Malignancy is not identified in the material sampled. These
findings may not be representative of a larger lesion and excision
is advised for definitive classification.

This was found to be concordant with Dr. Rahilya impression and
notes.
Recommendation: Surgical referral/excision for atypia and
possibility of a phyllodes tumor.
At the patient's request, results and recommendations were relayed
to the patient by phone by Jim, Davis on 09/23/17. The patient
stated she did well following the biopsy with bruising but no
bleeding. Post biopsy instructions were reviewed with the patient
and all of her questions were answered. She requested her referral
be made to a provider within her insurance network. Results were
faxed to Ashuk Pop, CNM on 09/27/17. I also spoke to Ashuk Pop,
CNM on 09/28/17 and relayed the patient's request regarding the
referral. She will place a referral request to [REDACTED] [HOSPITAL] office. The surgery office will contact the
patient to arrange the appointment time.
Addendum by Jim, Davis on 09/28/17.
CLINICAL DATA: Left breast mass biopsy
EXAM:
ULTRASOUND GUIDED LEFT BREAST CORE NEEDLE BIOPSY

[Series 1: MG view · 0.06mm/px · 8 of 8 slices shown]
[im 1/8]
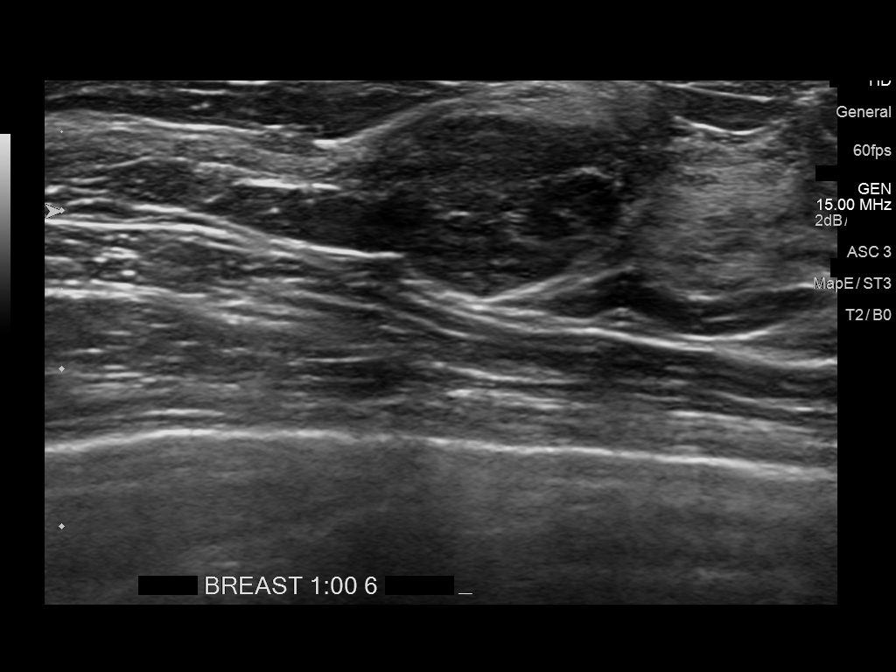
[im 2/8]
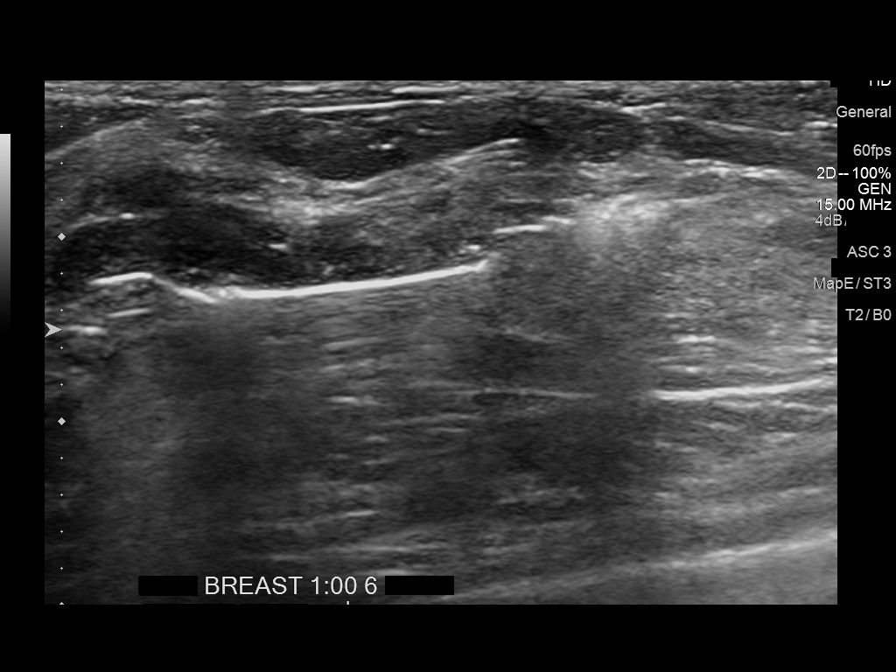
[im 3/8]
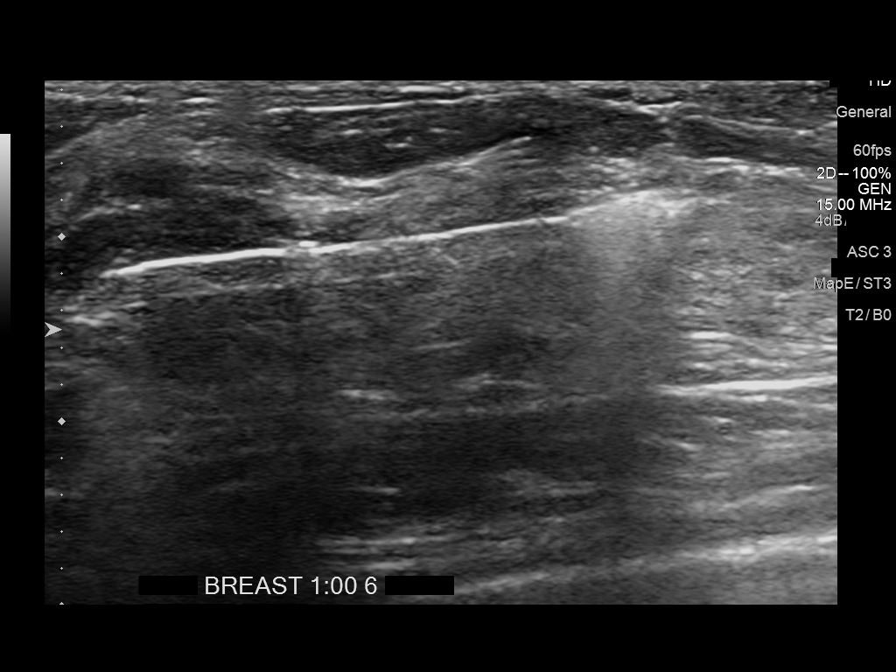
[im 4/8]
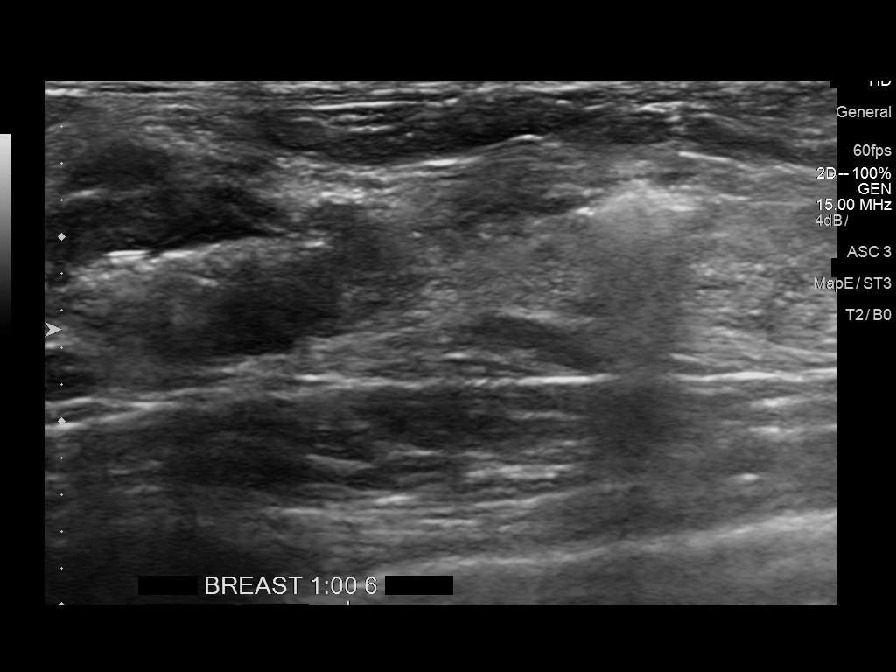
[im 5/8]
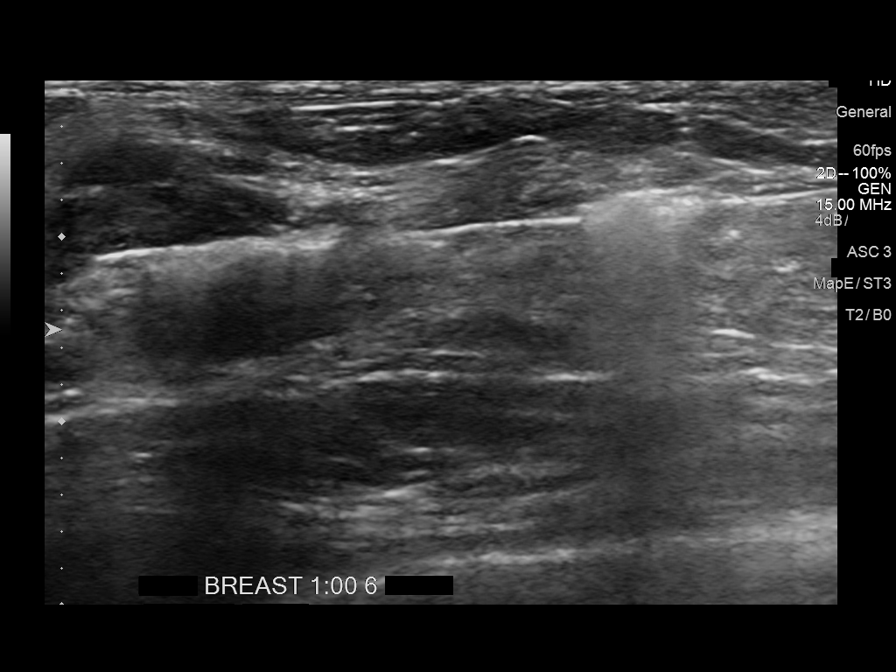
[im 6/8]
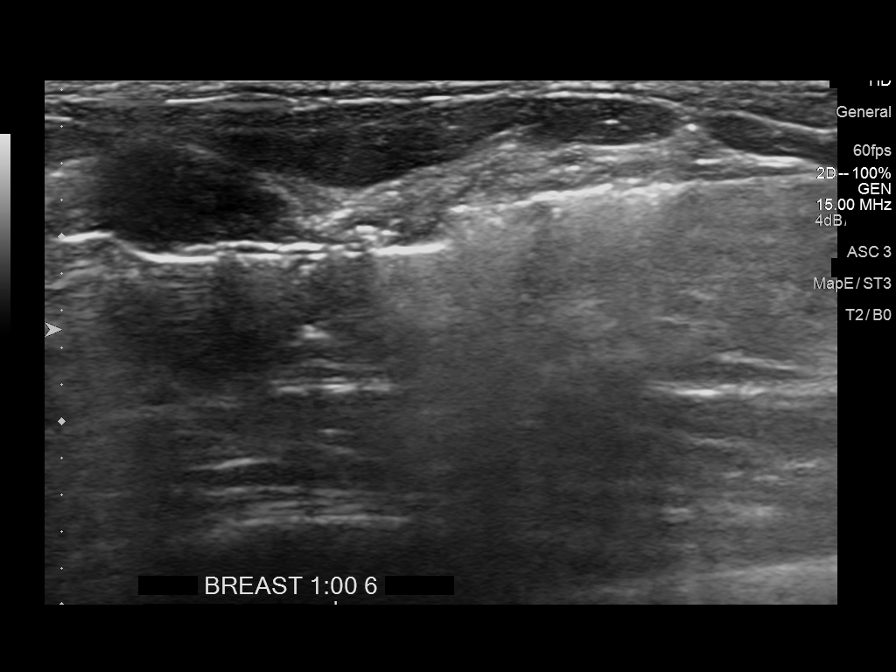
[im 7/8]
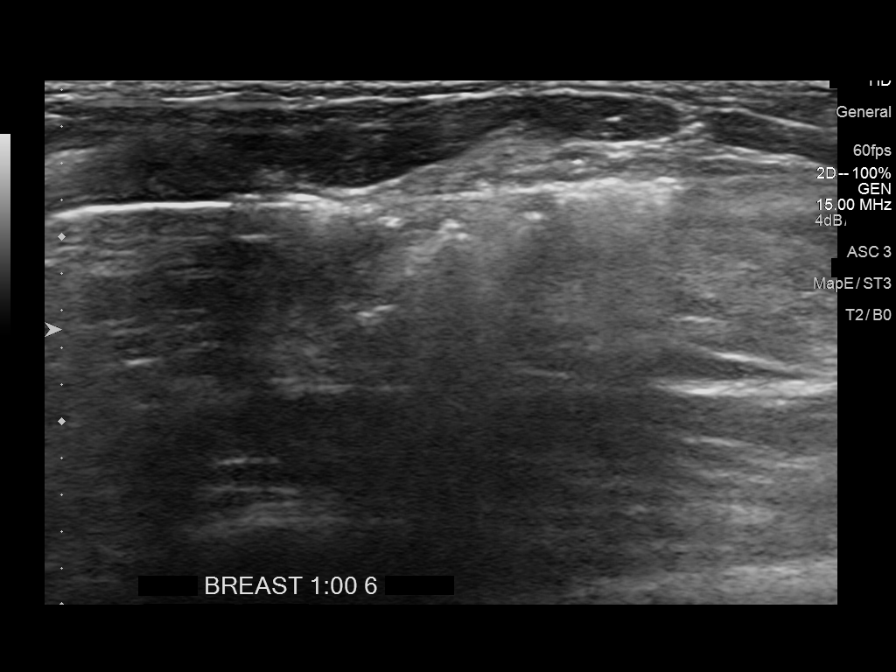
[im 8/8]
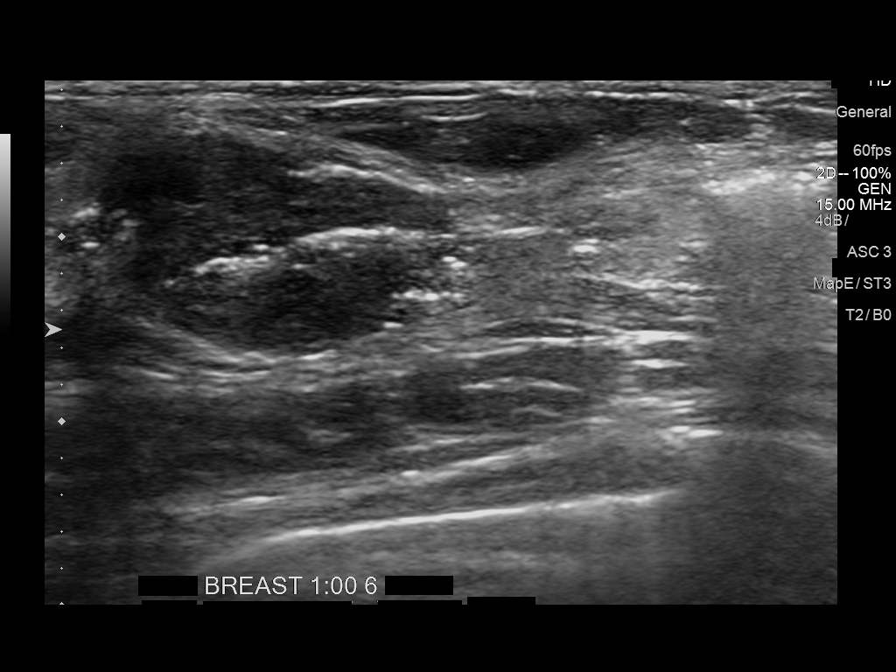

[8 of 8 positions shown; findings below may reference images not displayed]



Lesion quadrant: Upper-outer

Using sterile technique and 1% Lidocaine as local anesthetic, under
direct ultrasound visualization, a 11 gauge Mi device was
used to perform biopsy of the left breast mass using a lateral
approach. At the conclusion of the procedure a tissue marker clip
was deployed into the biopsy cavity. Follow up 2 view mammogram was
performed and dictated separately.
IMPRESSION: Ultrasound guided biopsy of left breast mass. No apparent
complications.

## 2018-11-17 ENCOUNTER — Other Ambulatory Visit: Payer: Self-pay

## 2018-11-17 DIAGNOSIS — Z20822 Contact with and (suspected) exposure to covid-19: Secondary | ICD-10-CM

## 2018-11-19 LAB — NOVEL CORONAVIRUS, NAA: SARS-CoV-2, NAA: NOT DETECTED

## 2018-11-21 ENCOUNTER — Other Ambulatory Visit: Payer: Self-pay

## 2018-11-21 DIAGNOSIS — Z20822 Contact with and (suspected) exposure to covid-19: Secondary | ICD-10-CM

## 2018-11-23 LAB — NOVEL CORONAVIRUS, NAA: SARS-CoV-2, NAA: NOT DETECTED

## 2019-08-12 IMAGING — US US BREAST*L* LIMITED INC AXILLA
1 series · 12 of 12 positions shown · non-contrast
Comparison: None.

CLINICAL DATA: Left breast upper outer quadrant area of palpable
concern felt by the patient in [REDACTED]. Per patient's report there has
been a slight increase in size of the area of palpable concern over
this time.

EXAM:
ULTRASOUND OF THE LEFT BREAST

[Series 1: us breast*left* limited inc axilla · 0.06mm/px · 12 of 12 slices shown]
[im 1/12]
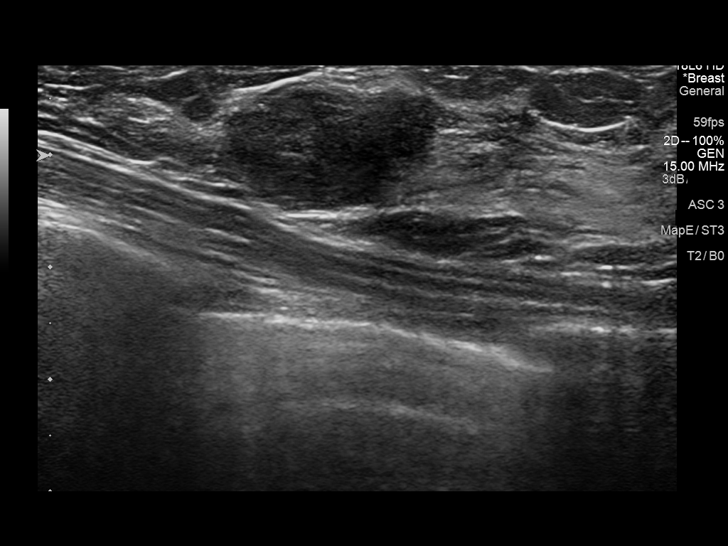
[im 2/12]
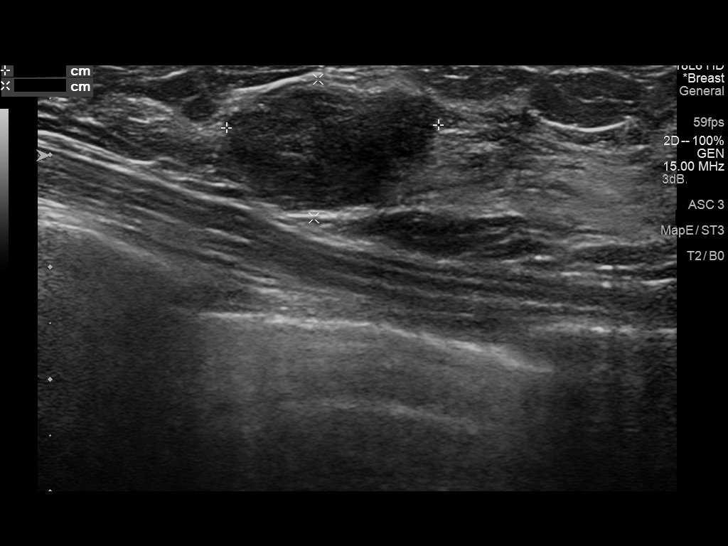
[im 3/12]
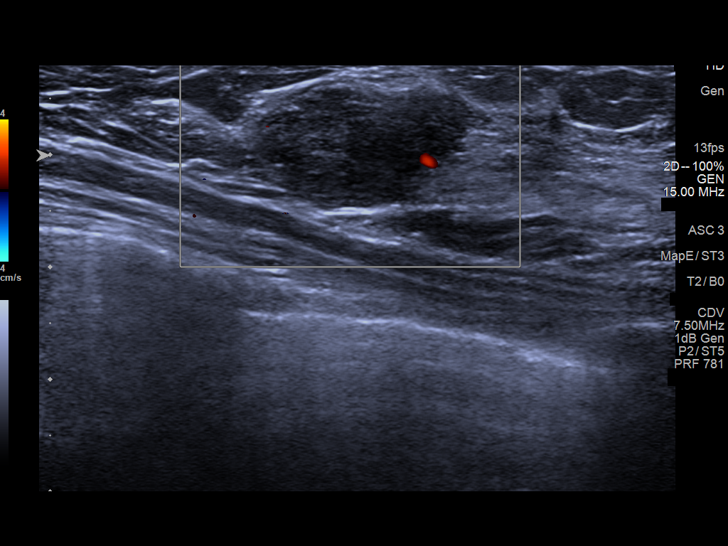
[im 4/12]
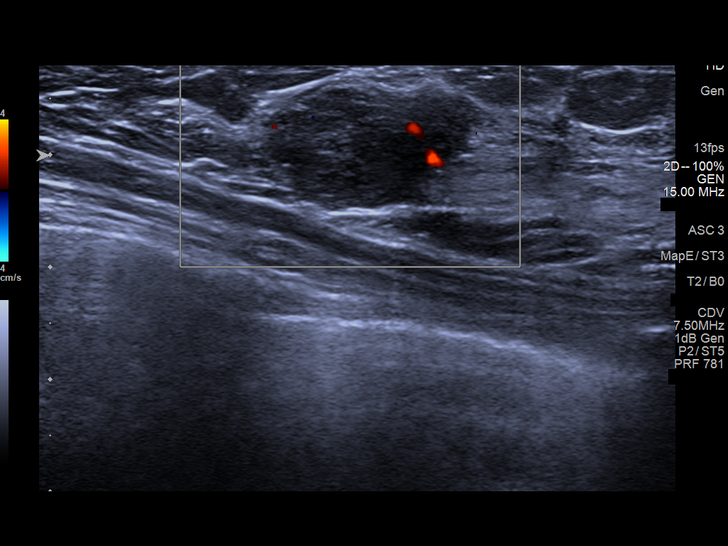
[im 5/12]
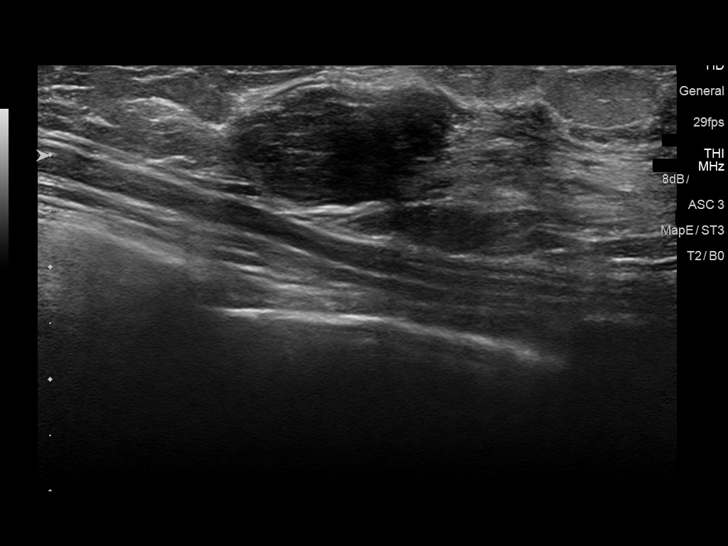
[im 6/12]
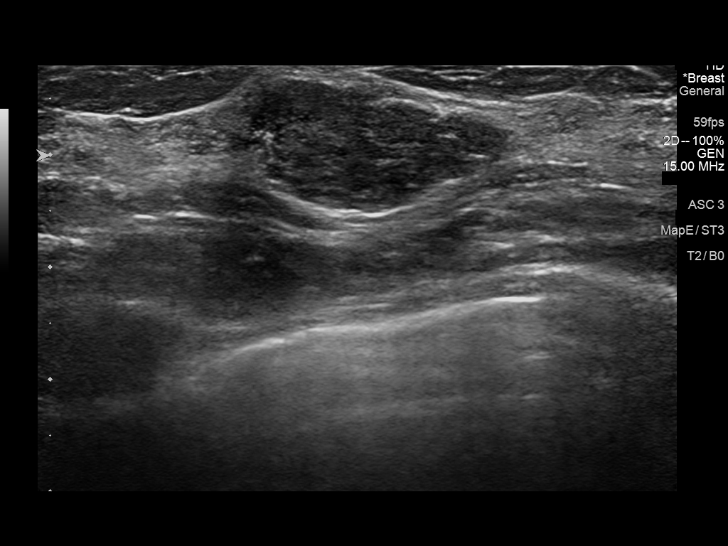
[im 7/12]
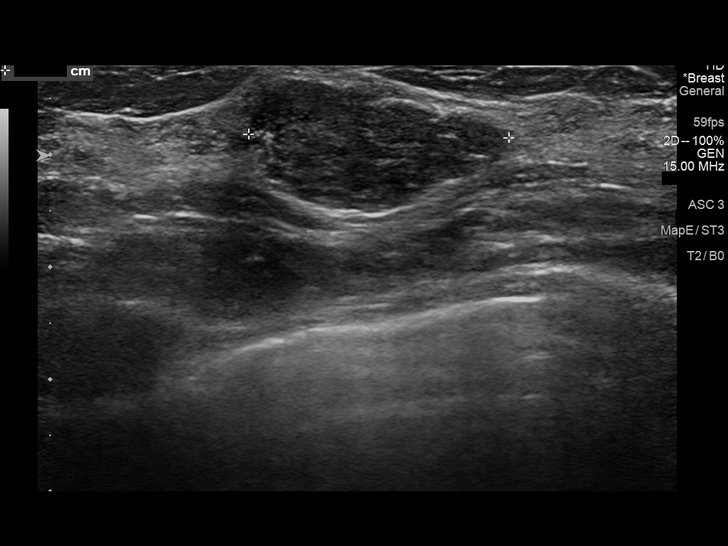
[im 8/12]
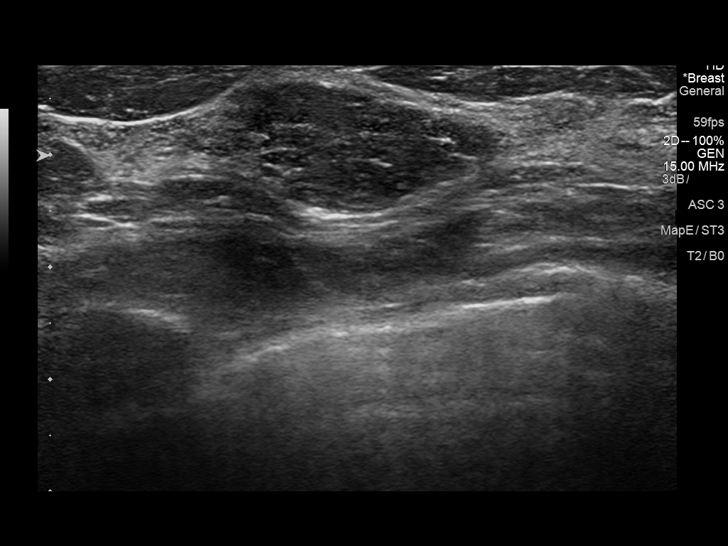
[im 9/12]
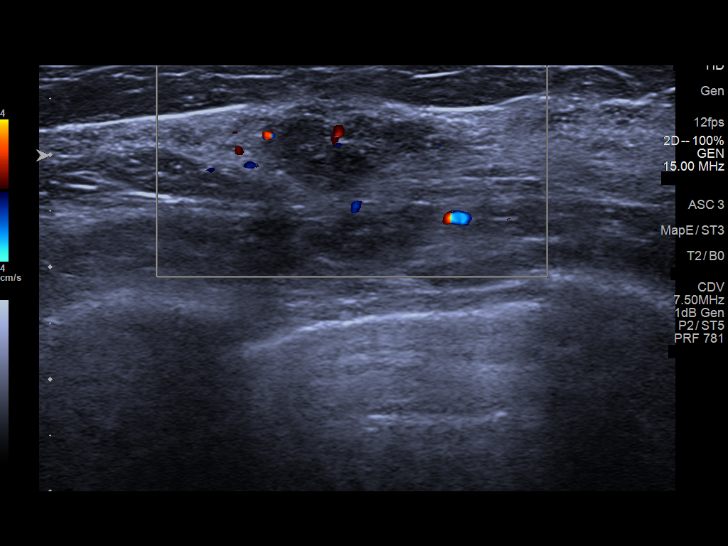
[im 10/12]
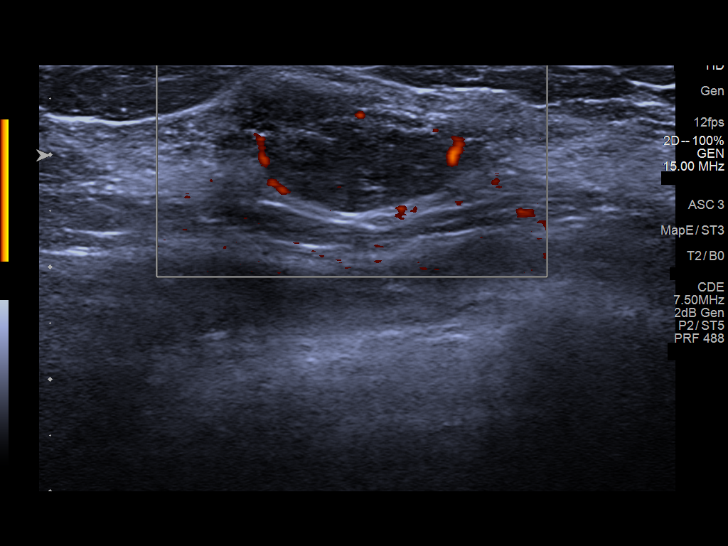
[im 11/12]
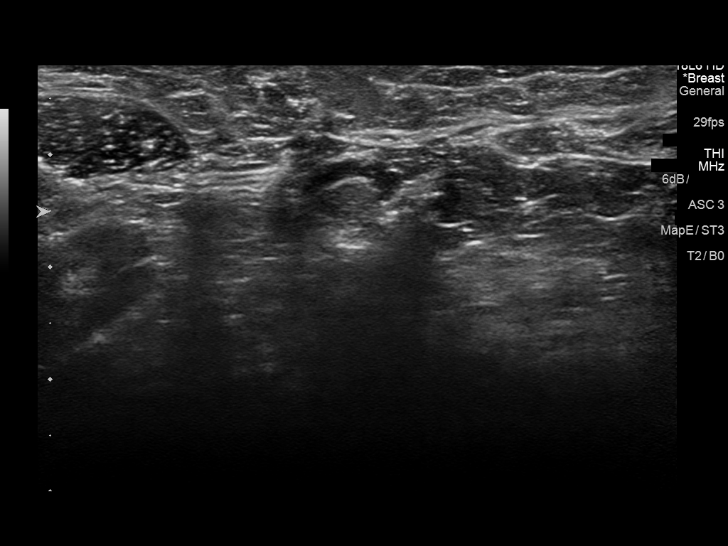
[im 12/12]
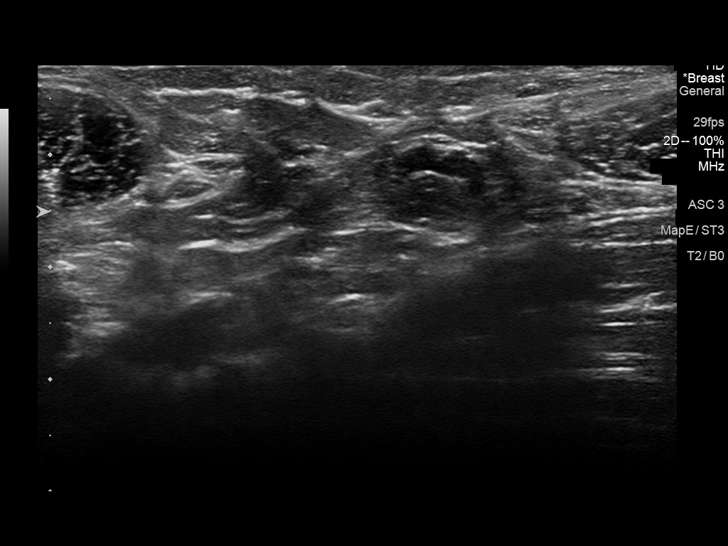

[12 of 12 positions shown; findings below may reference images not displayed]

FINDINGS: On physical exam, there is a palpable moderately firm approximately
2 cm mass in the left breast 1 o'clock, middle depth.

Targeted ultrasound is performed, showing left breast 1 o'clock 6 cm
from the nipple hypoechoic slightly irregular lobulated mass which
measures 1.9 x 1.2 by 2.3 cm. Slightly increased internal
vascularity is noted. There is no evidence of left axillary
lymphadenopathy.
IMPRESSION: Left breast 1 o'clock 2.3 cm palpable mass, for which
ultrasound-guided core needle biopsy is recommended.

RECOMMENDATION:
Ultrasound-guided core needle biopsy of left breast 1 o'clock
palpable mass.

I have discussed the findings and recommendations with the patient.
Results were also provided in writing at the conclusion of the
visit. If applicable, a reminder letter will be sent to the patient
regarding the next appointment.

BI-RADS CATEGORY  4: Suspicious.

## 2020-03-12 ENCOUNTER — Other Ambulatory Visit: Payer: Self-pay | Admitting: Obstetrics and Gynecology

## 2020-03-12 DIAGNOSIS — N63 Unspecified lump in unspecified breast: Secondary | ICD-10-CM

## 2020-03-19 ENCOUNTER — Other Ambulatory Visit: Payer: Self-pay

## 2020-03-19 ENCOUNTER — Ambulatory Visit
Admission: RE | Admit: 2020-03-19 | Discharge: 2020-03-19 | Disposition: A | Discharge status: Home / Self Care | Payer: BC Managed Care – PPO | Source: Ambulatory Visit | Attending: Obstetrics and Gynecology | Admitting: Obstetrics and Gynecology

## 2020-03-19 DIAGNOSIS — N63 Unspecified lump in unspecified breast: Secondary | ICD-10-CM

## 2021-03-21 IMAGING — US US BREAST*R* LIMITED INC AXILLA
1 series · 4 of 4 positions shown · non-contrast
Comparison: Previous exam(s).

CLINICAL DATA: Bilateral areas of palpable concern felt by the
patient.

History of prior benign excisional biopsy of left breast upper outer
quadrant demonstrating fibroadenoma.
EXAM:
ULTRASOUND OF THE BILATERAL BREAST

[Series 1: us breast*right* limited inc axilla · 0.06mm/px · 4 of 4 slices shown]
[im 1/4]
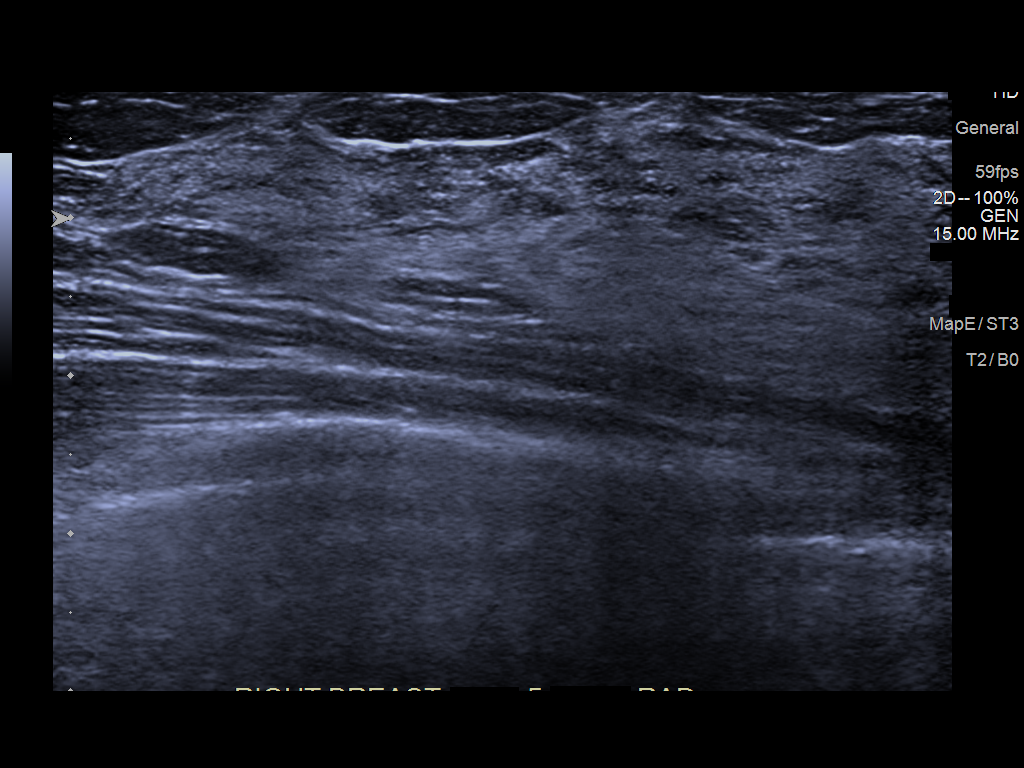
[im 2/4]
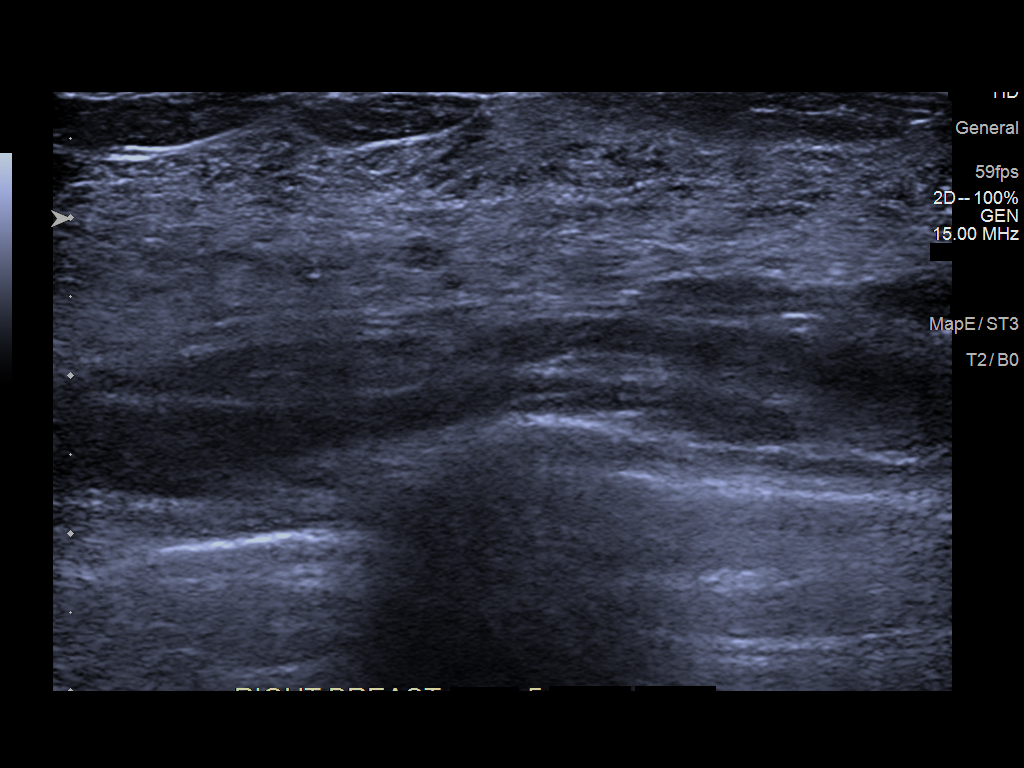
[im 3/4]
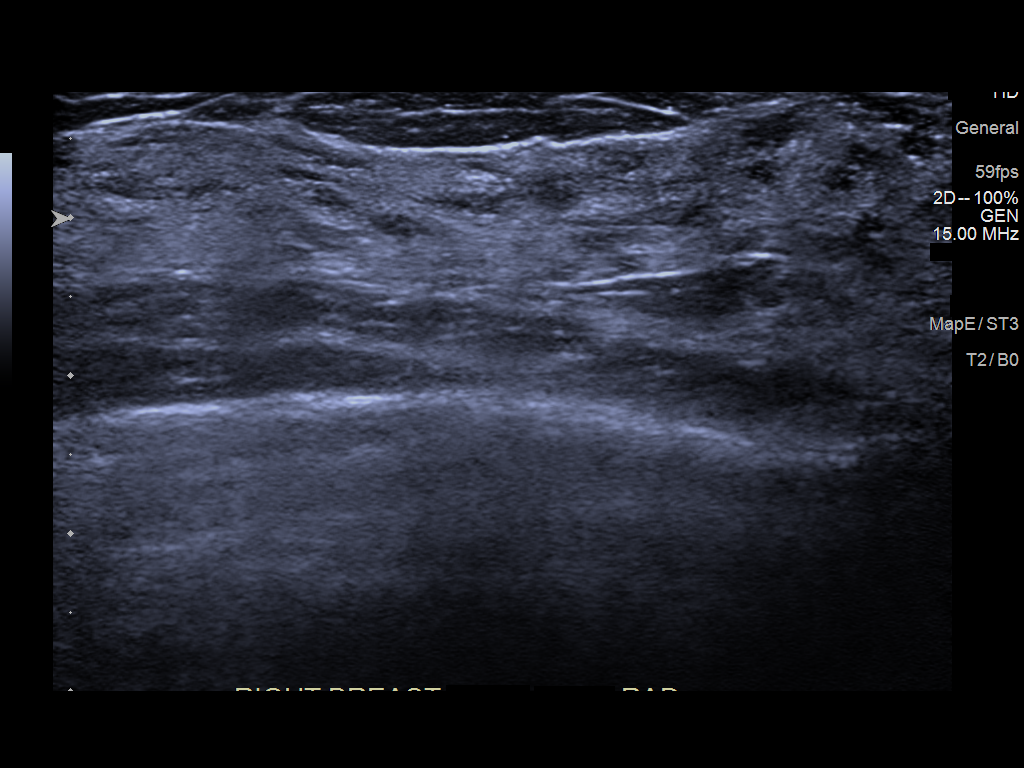
[im 4/4]
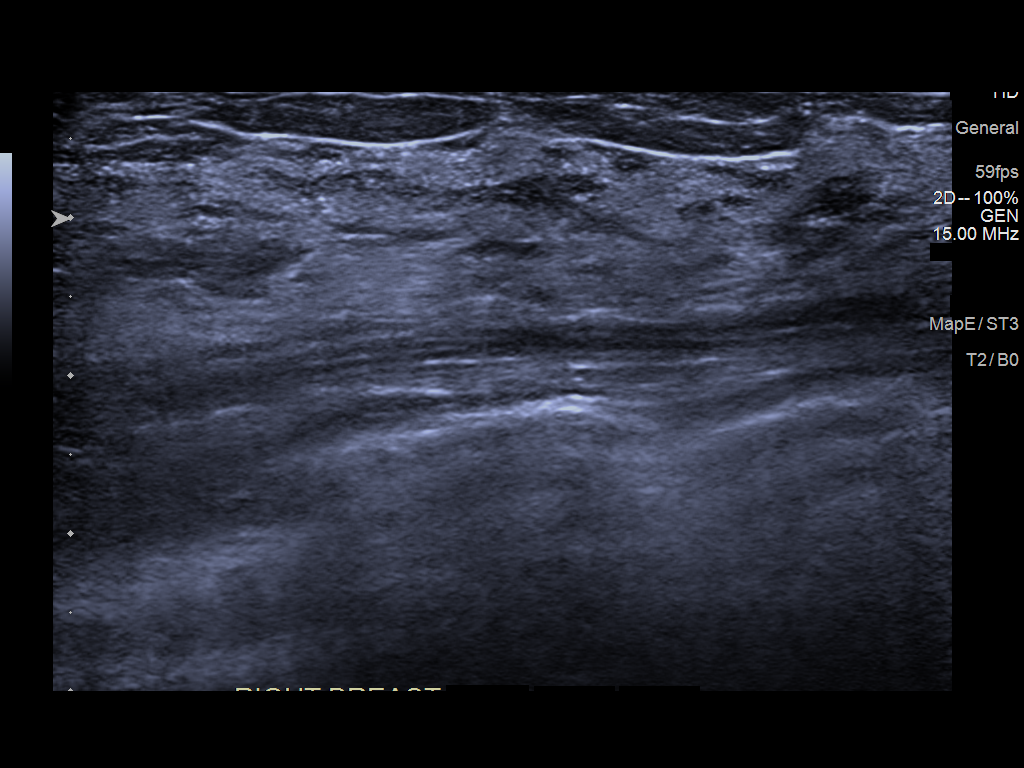

[4 of 4 positions shown; findings below may reference images not displayed]

FINDINGS: Targeted right breast ultrasound is performed, targeting the area of
palpable concern at 10 o'clock 5 cm from the nipple showing no
suspicious masses or shadowing lesions.

Targeted left breast ultrasound is performed, targeting the area of
palpable concern at 1 o'clock 6 cm from the nipple mass showing no
suspicious masses or shadowing lesions.
IMPRESSION: No sonographic evidence of breast malignancy in either breast, on
focused breast ultrasound.

RECOMMENDATION:
Further management of patient's areas of palpable concern should be
based on clinical grounds.

Screening mammogram at age 41 unless there are persistent or
intervening clinical concerns. (Code:[id number]-[id number])

I have discussed the findings and recommendations with the patient.
If applicable, a reminder letter will be sent to the patient
regarding the next appointment.

BI-RADS CATEGORY  1: Negative.
